# Patient Record
Sex: Female | Born: 1995 | Race: Asian | Hispanic: No | Marital: Single | Smoking: Never smoker
Health system: Southern US, Community
[De-identification: ages and names within clinical notes are randomized; demographics above are authoritative.]

## PROBLEM LIST (undated history)

## (undated) DIAGNOSIS — L900 Lichen sclerosus et atrophicus: Secondary | ICD-10-CM

## (undated) DIAGNOSIS — D75A Glucose-6-phosphate dehydrogenase (G6PD) deficiency without anemia: Secondary | ICD-10-CM

## (undated) DIAGNOSIS — G43909 Migraine, unspecified, not intractable, without status migrainosus: Secondary | ICD-10-CM

## (undated) DIAGNOSIS — L209 Atopic dermatitis, unspecified: Secondary | ICD-10-CM

## (undated) HISTORY — DX: Migraine, unspecified, not intractable, without status migrainosus: G43.909

## (undated) HISTORY — DX: Atopic dermatitis, unspecified: L20.9

## (undated) HISTORY — DX: Glucose-6-phosphate dehydrogenase (G6PD) deficiency without anemia: D75.A

## (undated) HISTORY — DX: Lichen sclerosus et atrophicus: L90.0

---
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
## 2018-03-19 ENCOUNTER — Encounter (HOSPITAL_COMMUNITY): Payer: Self-pay

## 2018-03-19 ENCOUNTER — Other Ambulatory Visit: Payer: Self-pay

## 2018-03-19 ENCOUNTER — Ambulatory Visit (HOSPITAL_COMMUNITY)
Admission: EM | Admit: 2018-03-19 | Discharge: 2018-03-19 | Disposition: A | Discharge status: Home / Self Care | Payer: 59 | Attending: Urgent Care | Admitting: Urgent Care

## 2018-03-19 DIAGNOSIS — B9789 Other viral agents as the cause of diseases classified elsewhere: Secondary | ICD-10-CM | POA: Insufficient documentation

## 2018-03-19 DIAGNOSIS — J069 Acute upper respiratory infection, unspecified: Secondary | ICD-10-CM | POA: Diagnosis not present

## 2018-03-19 DIAGNOSIS — R5383 Other fatigue: Secondary | ICD-10-CM | POA: Insufficient documentation

## 2018-03-19 DIAGNOSIS — R5381 Other malaise: Secondary | ICD-10-CM | POA: Insufficient documentation

## 2018-03-19 MED ORDER — PROMETHAZINE-DM 6.25-15 MG/5ML PO SYRP: 5.0000 mL | ORAL_SOLUTION | Freq: Two times a day (BID) | ORAL | 0 refills | Status: DC | PRN

## 2018-03-19 MED ORDER — PREDNISONE 20 MG PO TABS: ORAL_TABLET | ORAL | 0 refills | Status: DC

## 2018-03-19 MED ORDER — BENZONATATE 100 MG PO CAPS: 100.0000 mg | ORAL_CAPSULE | Freq: Three times a day (TID) | ORAL | 0 refills | Status: DC | PRN

## 2018-03-19 NOTE — ED Triage Notes (Signed)
Pt cc coughing, chills , fatigued and runny nose x 1 week.

## 2018-03-19 NOTE — Discharge Instructions (Signed)
For sore throat try using a honey-based tea. Use 3 teaspoons of honey with juice squeezed from half lemon. Place shaved pieces of ginger into 1/2-1 cup of water and warm over stove top. Then mix the ingredients and repeat every 4 hours as needed. We will manage this as a viral syndrome. Please take ibuprofen 400mg  every 6 hours alternating with Tylenol 500mg  every 6 hours. Hydrate very well with at least 2 liters of water. Eat light meals such as soups to replenish electrolytes and get good nutrition. Start an antihistamine like Zyrtec, Allegra or Claritin.

## 2018-03-19 NOTE — ED Provider Notes (Signed)
MRN: [id number] DOB: [birth year]  Subjective:   Tara Merritt is a 23 y.o. female presenting for 1 week history of persistent, worsening, constant dry hacking cough that elicits throat pain. Has tried DayQuil, NyQuil, Tussin, Mucinex.  Denies using chronic medications.  Denies history of asthma, allergies. Denies smoking cigarettes.   No Known Allergies  Has a history of eczema, uses clobetasol.    Past Surgical History:  Procedure Laterality Date  . WISDOM TOOTH EXTRACTION  2013   Review of Systems  Constitutional: Positive for chills, fever (initially but improved) and malaise/fatigue.  HENT: Positive for congestion. Negative for ear pain and sinus pain.        Left-sided ear fullness that is now improved.  Eyes: Negative for blurred vision and double vision.  Respiratory: Positive for shortness of breath (with coughing fits). Negative for hemoptysis.   Cardiovascular: Negative for chest pain and palpitations.  Gastrointestinal: Negative for abdominal pain, constipation, diarrhea, nausea and vomiting.  Genitourinary: Negative for dysuria and frequency.  Musculoskeletal: Negative for myalgias.  Skin: Negative for rash.  Neurological: Positive for headaches (initially but now improved). Negative for dizziness.  Psychiatric/Behavioral: Negative for depression.   Objective:   Vitals: BP (!) 109/55 (BP Location: Left Arm)   Pulse 75   Temp 97.9 F (36.6 C)   Resp 18   Wt 105 lb (47.6 kg)   LMP 03/14/2018   SpO2 100%   Physical Exam Constitutional:      General: She is not in acute distress.    Appearance: Normal appearance. She is well-developed. She is not ill-appearing, toxic-appearing or diaphoretic.  HENT:     Head: Normocephalic and atraumatic.     Right Ear: Tympanic membrane and ear canal normal. No drainage or tenderness. No middle ear effusion. Tympanic membrane is not erythematous.     Left Ear: Tympanic membrane and ear canal normal. No drainage or tenderness.   No middle ear effusion. Tympanic membrane is not erythematous.     Nose: Nose normal. No congestion or rhinorrhea.     Mouth/Throat:     Mouth: Mucous membranes are moist. No oral lesions.     Pharynx: Oropharynx is clear. No pharyngeal swelling, oropharyngeal exudate, posterior oropharyngeal erythema or uvula swelling.     Tonsils: No tonsillar exudate or tonsillar abscesses.  Eyes:     General: No scleral icterus.       Right eye: No discharge.        Left eye: No discharge.     Extraocular Movements: Extraocular movements intact.     Right eye: Normal extraocular motion.     Left eye: Normal extraocular motion.     Conjunctiva/sclera: Conjunctivae normal.     Pupils: Pupils are equal, round, and reactive to light.  Neck:     Musculoskeletal: Normal range of motion and neck supple.  Cardiovascular:     Rate and Rhythm: Normal rate and regular rhythm.     Pulses: Normal pulses.     Heart sounds: Normal heart sounds. No murmur. No friction rub. No gallop.   Pulmonary:     Effort: Pulmonary effort is normal. No respiratory distress.     Breath sounds: Normal breath sounds. No stridor. No wheezing, rhonchi or rales.  Lymphadenopathy:     Cervical: No cervical adenopathy.  Skin:    General: Skin is warm and dry.     Findings: No rash.  Neurological:     General: No focal deficit present.     Mental Status:  She is alert and oriented to person, place, and time.  Psychiatric:        Mood and Affect: Mood normal.        Behavior: Behavior normal.        Thought Content: Thought content normal.     Assessment and Plan :   Viral URI with cough  Malaise and fatigue  Likely viral in etiology d/t reassuring physical exam findings. Advised supportive care, offered symptomatic relief.  Patient would like to be aggressive in her management given that she is going to travel soon and wants to feel relief from her cough already.  We agreed to use prednisone which she has used in the past.  Counseled patient on potential for adverse effects with medications prescribed today, patient verbalized understanding.  Return-to-clinic precautions discussed, patient verbalized understanding.     Tara Merritt, Tara Merritt, [place] 03/19/18 [phone]

## 2018-04-02 ENCOUNTER — Ambulatory Visit: Payer: Self-pay | Admitting: *Deleted

## 2018-04-02 NOTE — Telephone Encounter (Signed)
Pt reports productive cough, greenish phlegm. LGT of 99.0 this am. Reports sinus pressure, facial tenderness, eyes watery. Pt seen in ED 03/19/2018 for similar symptoms. States "Felt better then symptoms reoccured while in [place] 1/16 to 1/22,  along with a stomach virus."  Pts main concern is she had been around coworkers while in [place] who had been to [place]. States worried she has been exposed to Coronavirus. States her boss also has cough, "But he said it wasn't coronavirus."  Pt has New Patient appt tomorrow with Dr. Mardelle Matte. She is questioning if testing for this virus is available. Pt had to end call prior to any disposition as she is at work.   Requesting CB with advise. [phone]  Reason for Disposition . [1] Fever returns after gone for over 24 hours AND [2] symptoms worse or not improved  Answer Assessment - Initial Assessment Questions 1. ONSET: "When did the cough begin?"      "Beginning of January, 2. SEVERITY: "How bad is the cough today?"      HAs worsened since 23rd 3. RESPIRATORY DISTRESS: "Describe your breathing."      Congested, no SOB 4. FEVER: "Do you have a fever?" If so, ask: "What is your temperature, how was it measured, and when did it start?"     99.0 5. SPUTUM: "Describe the color of your sputum" (clear, white, yellow, green)    Greenish 6. HEMOPTYSIS: "Are you coughing up any blood?" If so ask: "How much?" (flecks, streaks, tablespoons, etc.)     no 7. CARDIAC HISTORY: "Do you have any history of heart disease?" (e.g., heart attack, congestive heart failure)      no 8. LUNG HISTORY: "Do you have any history of lung disease?"  (e.g., pulmonary embolus, asthma, emphysema)     no 9. PE RISK FACTORS: "Do you have a history of blood clots?" (or: recent major surgery, recent prolonged travel, bedridden)     no 10. OTHER SYMPTOMS: "Do you have any other symptoms?" (e.g., runny nose, wheezing, chest pain)       Sinus tenderness, eyes watery; "Pseudofed helped" 11.  PREGNANCY: "Is there any chance you are pregnant?" "When was your last menstrual period?"        12. TRAVEL: "Have you traveled out of the country in the last month?" (e.g., travel history, exposures)      Yes; to [place] Jan. 16th to 22nd.Business trip, around co-workers who had been to [place].  Protocols used: COUGH - ACUTE PRODUCTIVE-A-AH

## 2018-04-02 NOTE — Telephone Encounter (Signed)
Pt aware and verbalized understanding.  

## 2018-04-02 NOTE — Telephone Encounter (Signed)
Please see below and advise.

## 2018-04-02 NOTE — Telephone Encounter (Signed)
Please call patient: currently, she does not meet criteria for a suspected case of the newly identified coronavirus.  Please report fever > 101.5 and contact with a pt who traveled to/from [place] and is under investigation for the virus.  Because she has not had contact with a suspected or confirmed case and is not having fevers and has traveled to [place] herself, I suspect she has a different infection.

## 2018-04-03 ENCOUNTER — Encounter: Payer: Self-pay | Admitting: Family Medicine

## 2018-04-03 ENCOUNTER — Other Ambulatory Visit: Payer: Self-pay

## 2018-04-03 ENCOUNTER — Ambulatory Visit (INDEPENDENT_AMBULATORY_CARE_PROVIDER_SITE_OTHER): Payer: 59 | Admitting: Family Medicine

## 2018-04-03 VITALS — BP 104/72 | HR 70 | Temp 98.6°F | Resp 14 | Ht 59.5 in | Wt 102.8 lb

## 2018-04-03 DIAGNOSIS — L239 Allergic contact dermatitis, unspecified cause: Secondary | ICD-10-CM | POA: Insufficient documentation

## 2018-04-03 DIAGNOSIS — L900 Lichen sclerosus et atrophicus: Secondary | ICD-10-CM

## 2018-04-03 DIAGNOSIS — L209 Atopic dermatitis, unspecified: Secondary | ICD-10-CM

## 2018-04-03 DIAGNOSIS — J208 Acute bronchitis due to other specified organisms: Secondary | ICD-10-CM | POA: Diagnosis not present

## 2018-04-03 DIAGNOSIS — D75A Glucose-6-phosphate dehydrogenase (G6PD) deficiency without anemia: Secondary | ICD-10-CM | POA: Diagnosis not present

## 2018-04-03 DIAGNOSIS — B9689 Other specified bacterial agents as the cause of diseases classified elsewhere: Secondary | ICD-10-CM

## 2018-04-03 DIAGNOSIS — Z3041 Encounter for surveillance of contraceptive pills: Secondary | ICD-10-CM | POA: Insufficient documentation

## 2018-04-03 HISTORY — DX: Atopic dermatitis, unspecified: L20.9

## 2018-04-03 HISTORY — DX: Glucose-6-phosphate dehydrogenase (G6PD) deficiency without anemia: D75.A

## 2018-04-03 HISTORY — DX: Lichen sclerosus et atrophicus: L90.0

## 2018-04-03 MED ORDER — AMOXICILLIN-POT CLAVULANATE 875-125 MG PO TABS: 1.0000 | ORAL_TABLET | Freq: Two times a day (BID) | ORAL | 0 refills | Status: DC

## 2018-04-03 NOTE — Progress Notes (Signed)
Subjective  CC:  Chief Complaint  Patient presents with  . Establish Care    Moved to the area recently Dr. Al CorpusHyatt  . Cough    Was in [place] 1/16-1/22    HPI: Tara Merritt is a 23 y.o. female who presents to [facility] today to establish care with me as a new patient.   She has the following concerns or needs:  See triage note from yesterday. Here with coughing URI illness since 1/23: reports productive cough, low grade fevers, myalgias, mild sore throat, chills and thick drainage w/o sob or chest pain. No GI sxs. Has recently been to [place]. Was treated with pred and cough meds 1/9 by UC and improved but cough never completely resolved. No h/o asthma. Nonsmoker. Hasn't had flu shot yet this season but is willing to get it. No known exposure to new coronavirus patient or suspected patient.   Reviewed PMH and detailed in PL. All controlled.   Lives alone, long distance relationship with [place] who lives in [place]. 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 Parents live in [place] and are retired. No siblings. Adopted. No h/o mood problems. Due for cpe with pap.   Assessment  1. Acute bacterial bronchitis   2. G6PD deficiency   3. Oral contraceptive use   4. Lichen sclerosus      Plan   Flu test negative. Bacterial bronchitis vs sinusitis given symptom complex: start augmentin, treat cough and fevers with otc meds and prescribed cough syrup from last visit. Recheck 2 weeks.   Follow up:  Return in about 2 weeks (around 04/17/2018) for complete physical. and recheck cough No orders of the defined types were placed in this encounter.  Meds ordered this encounter  Medications  . amoxicillin-clavulanate (AUGMENTIN) 875-125 MG tablet    Sig: Take 1 tablet by mouth 2 (two) times daily.    Dispense:  14 tablet    Refill:  0     Depression screen [facility] 2/9 04/03/2018 04/03/2018  Decreased Interest 0 0  Down,  Depressed, Hopeless 0 0  PHQ - 2 Score 0 0  Difficult doing work/chores - Not difficult at all    We updated and reviewed the patient's past history in detail and it is documented below.  Patient Active Problem List   Diagnosis Date Noted  . 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 Oral contraceptive use 04/03/2018  . Lichen sclerosus 04/03/2018    On clobetasol   . 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 HIV Screening  01/29/2009  . TETANUS/TDAP  01/29/2013  . PAP-Cervical Cytology Screening  01/30/2015  . PAP SMEAR-Modifier  01/30/2015  . INFLUENZA VACCINE  10/05/2017    There is no immunization history on file for this patient. Current Meds  Medication Sig  . benzonatate (TESSALON) 100 MG capsule Take 1-2 capsules (100-200 mg total) by mouth 3 (three) times daily as needed.  . clobetasol ointment (TEMOVATE) 0.05 % Apply 1 application topically 2 (two) times a week.  . drospirenone-ethinyl estradiol (YAZ,GIANVI,LORYNA) 3-0.02 MG tablet Take 1 tablet by mouth daily.  . Multiple Vitamin (MULTIVITAMIN WITH MINERALS) TABS tablet Take 1 tablet by mouth daily.  . Probiotic Product (PROBIOTIC PO) Take by mouth.  . promethazine-dextromethorphan (PROMETHAZINE-DM) 6.25-15 MG/5ML syrup Take 5 mLs by mouth 2 (two) times daily as needed for cough.    Allergies: Patient has No Known Allergies. Past Medical History Patient  has a past medical history of Atopic dermatitis (04/03/2018), G6PD deficiency (04/03/2018), Lichen sclerosus (04/03/2018), and Migraine. Past Surgical History Patient  has a past surgical history that includes Wisdom tooth extraction (2013). Family History: Patient family history includes Healthy in her father and mother. She was adopted. Social History:  Patient  reports that she has never smoked. She has never used smokeless tobacco. She reports current alcohol use. She reports that she does not use drugs.  Review of Systems: Constitutional:  negative for fever or malaise Ophthalmic: negative for photophobia, double vision or loss of vision Cardiovascular: negative for chest pain, dyspnea on exertion, or new LE swelling Respiratory: negative for SOB +persistent cough Gastrointestinal: negative for abdominal pain, change in bowel habits or melena Genitourinary: negative for dysuria or gross hematuria Musculoskeletal: negative for new gait disturbance or muscular weakness Integumentary: negative for new or persistent rashes Neurological: negative for TIA or stroke symptoms Psychiatric: negative for SI or delusions Allergic/Immunologic: negative for hives  Patient Care Team    Relationship Specialty Notifications Start End  [name], MD PCP - General Family Medicine  04/03/18     Objective  Vitals: BP 104/72   Pulse 70   Temp 98.6 F (37 C) (Oral)   Resp 14   Ht 4' 11.5" (1.511 m)   Wt 102 lb 12.8 oz (46.6 kg)   LMP 03/29/2018   SpO2 98%   BMI 20.42 kg/m  General:  Well developed, well nourished, no acute distress , clammy with some coughing, no respiratory distress Psych:  Alert and oriented,normal mood and affect HEENT:  Normocephalic, atraumatic, non-icteric sclera, PERRL, oropharynx is without mass or exudate, supple neck without adenopathy, mass or thyromegaly Cardiovascular:  RRR without gallop, rub or murmur, nondisplaced PMI Respiratory:  Good breath sounds bilaterally, CTAB with normal respiratory effort Gastrointestinal: normal bowel sounds, soft, non-tender, no noted masses. No HSM MSK: no deformities, contusions. Joints are without erythema or swelling Skin:  Warm, no rashes or suspicious lesions noted Neurologic:    Mental status is normal. Normal gait  Rapid flu test negative.   Commons side effects, risks, benefits, and alternatives for medications and treatment plan prescribed today were discussed, and the patient expressed understanding of the given instructions. Patient is instructed to call  or message via MyChart if he/she has any questions or concerns regarding our treatment plan. No barriers to understanding were identified. We discussed Red Flag symptoms and signs in detail. Patient expressed understanding regarding what to do in case of urgent or emergency type symptoms.   Medication list was reconciled, printed and provided to the patient in AVS. Patient instructions and summary information was reviewed with the patient as documented in the AVS. This note was prepared with assistance of Dragon voice recognition software. Occasional wrong-word or sound-a-like substitutions may have occurred due to the inherent limitations of voice recognition software

## 2018-04-03 NOTE — Patient Instructions (Addendum)
Please return in 2-4 weeks for your annual complete physical; please come fasting and to recheck cough.  Take all of the antibiotics as prescribed.  You may use Delsym cough syrup or Mucinex DM to help with congestion and coughing.  It was a pleasure meeting you today! Thank you for choosing us to meet your healthcare needs! I truly look forward to working with you. If you have any questions or concerns, please send me a message via Mychart or call the office at [phone].

## 2018-04-17 ENCOUNTER — Ambulatory Visit: Payer: Self-pay | Admitting: *Deleted

## 2018-04-17 NOTE — Telephone Encounter (Signed)
Yes, use the monistat. Will f/u tomorrow in office

## 2018-04-17 NOTE — Telephone Encounter (Signed)
Please see below and advise.

## 2018-04-17 NOTE — Telephone Encounter (Signed)
Pt is aware and advised to use the cream that came with the monistat

## 2018-04-17 NOTE — Telephone Encounter (Signed)
Pt completed 7 day course of  augmentin, ordered 04/03/2018.  States now with vaginal discharge, burning with urination. States discharge is "Normal" consistency, malodorous, bright yellow.  States she has H/O vaginal yeast infections "But this seems different." States has not been sexually active for 3 months.  Denies abdominal pain, lower back pain. States dysuria yesterday, not today. Started Monistat suppositories yesterday and is to complete 2 more days. Pt questioning if she should continue these or stop. Pt has appt tomorrow with Dr. Mardelle Matte CPE. Pt anxious; aware of appt tomorrow but would like CB regarding use of  Monistat. CB: [phone]    Reason for Disposition . Bad smelling vaginal discharge  Answer Assessment - Initial Assessment Questions 1. DISCHARGE: "Describe the discharge." (e.g., white, yellow, green, gray, foamy, cottage cheese-like)    "Bright yellow" 2. ODOR: "Is there a bad odor?"     Yes 3. ONSET: "When did the discharge begin?"     3 days ago 4. RASH: "Is there a rash in that area?" If so, ask: "Describe it." (e.g., redness, blisters, sores, bumps)    no 5. ABDOMINAL PAIN: "Are you having any abdominal pain?" If yes: "What does it feel like? " (e.g., crampy, dull, intermittent, constant)     no 6. ABDOMINAL PAIN SEVERITY: If present, ask: "How bad is it?"  (e.g., mild, moderate, severe)  - MILD - doesn't interfere with normal activities   - MODERATE - interferes with normal activities or awakens from sleep   - SEVERE - patient doesn't want to move (R/O peritonitis)      N/A 7. CAUSE: "What do you think is causing the discharge?" "Have you had the same problem before? What happened then?"    "Possibly yeast but seems different than ones I've had before." 8. OTHER SYMPTOMS: "Do you have any other symptoms?" (e.g., fever, itching, vaginal bleeding, pain with urination, injury to genital area, vaginal foreign body)     Burning with urination, none today 9. PREGNANCY:  "Is there any chance you are pregnant?" "When was your last menstrual period?"  Protocols used: VAGINAL DISCHARGE-A-AH

## 2018-04-18 ENCOUNTER — Other Ambulatory Visit: Payer: Self-pay

## 2018-04-18 ENCOUNTER — Other Ambulatory Visit (HOSPITAL_COMMUNITY)
Admission: RE | Admit: 2018-04-18 | Discharge: 2018-04-18 | Disposition: A | Discharge status: Home / Self Care | Payer: 59 | Source: Ambulatory Visit | Attending: Family Medicine | Admitting: Family Medicine

## 2018-04-18 ENCOUNTER — Encounter: Payer: Self-pay | Admitting: Family Medicine

## 2018-04-18 ENCOUNTER — Ambulatory Visit (INDEPENDENT_AMBULATORY_CARE_PROVIDER_SITE_OTHER): Payer: 59 | Admitting: Family Medicine

## 2018-04-18 VITALS — BP 102/66 | HR 74 | Temp 98.3°F | Resp 14 | Ht 60.0 in | Wt 102.2 lb

## 2018-04-18 DIAGNOSIS — Z124 Encounter for screening for malignant neoplasm of cervix: Secondary | ICD-10-CM

## 2018-04-18 DIAGNOSIS — L900 Lichen sclerosus et atrophicus: Secondary | ICD-10-CM | POA: Diagnosis not present

## 2018-04-18 DIAGNOSIS — Z0000 Encounter for general adult medical examination without abnormal findings: Secondary | ICD-10-CM | POA: Insufficient documentation

## 2018-04-18 DIAGNOSIS — Z3041 Encounter for surveillance of contraceptive pills: Secondary | ICD-10-CM

## 2018-04-18 DIAGNOSIS — N898 Other specified noninflammatory disorders of vagina: Secondary | ICD-10-CM | POA: Insufficient documentation

## 2018-04-18 DIAGNOSIS — D75A Glucose-6-phosphate dehydrogenase (G6PD) deficiency without anemia: Secondary | ICD-10-CM

## 2018-04-18 LAB — CBC WITH DIFFERENTIAL/PLATELET
Basophils Absolute: 0 10*3/uL (ref 0.0–0.1)
Basophils Relative: 0.5 % (ref 0.0–3.0)
Eosinophils Absolute: 0.3 10*3/uL (ref 0.0–0.7)
Eosinophils Relative: 3.4 % (ref 0.0–5.0)
HCT: 39.4 % (ref 36.0–46.0)
HEMOGLOBIN: 13.1 g/dL (ref 12.0–15.0)
Lymphocytes Relative: 24.5 % (ref 12.0–46.0)
Lymphs Abs: 2.1 10*3/uL (ref 0.7–4.0)
MCHC: 33.3 g/dL (ref 30.0–36.0)
MCV: 86.2 fl (ref 78.0–100.0)
MONO ABS: 0.6 10*3/uL (ref 0.1–1.0)
Monocytes Relative: 7.6 % (ref 3.0–12.0)
Neutro Abs: 5.4 10*3/uL (ref 1.4–7.7)
Neutrophils Relative %: 64 % (ref 43.0–77.0)
Platelets: 274 10*3/uL (ref 150.0–400.0)
RBC: 4.57 Mil/uL (ref 3.87–5.11)
RDW: 12.9 % (ref 11.5–15.5)
WBC: 8.4 10*3/uL (ref 4.0–10.5)

## 2018-04-18 LAB — LIPID PANEL
Cholesterol: 165 mg/dL (ref 0–200)
HDL: 58.5 mg/dL (ref >39.00)
LDL Cholesterol: 73 mg/dL (ref 0–99)
NonHDL: 106.09
TRIGLYCERIDES: 165 mg/dL — AB (ref 0.0–149.0)
Total CHOL/HDL Ratio: 3
VLDL: 33 mg/dL (ref 0.0–40.0)

## 2018-04-18 LAB — COMPREHENSIVE METABOLIC PANEL
ALT: 15 U/L (ref 0–35)
AST: 16 U/L (ref 0–37)
Albumin: 4.3 g/dL (ref 3.5–5.2)
Alkaline Phosphatase: 77 U/L (ref 39–117)
BUN: 11 mg/dL (ref 6–23)
CO2: 27 mEq/L (ref 19–32)
Calcium: 9.5 mg/dL (ref 8.4–10.5)
Chloride: 102 mEq/L (ref 96–112)
Creatinine, Ser: 0.69 mg/dL (ref 0.40–1.20)
GFR: 104.34 mL/min (ref >60.00)
Glucose, Bld: 73 mg/dL (ref 70–99)
POTASSIUM: 4.4 meq/L (ref 3.5–5.1)
Sodium: 137 mEq/L (ref 135–145)
Total Bilirubin: 0.5 mg/dL (ref 0.2–1.2)
Total Protein: 7.1 g/dL (ref 6.0–8.3)

## 2018-04-18 NOTE — Patient Instructions (Addendum)
Please return in 12 months for your annual complete physical; please come fasting.  Please sign up for Mychart so I may release your lab test results to you when they are ready.   If you have any questions or concerns, please don't hesitate to send me a message via MyChart or call the office at [phone]. Thank you for visiting with us today! It's our pleasure caring for you.  Please do these things to maintain good health!   Exercise at least 30-45 minutes a day,  4-5 days a week.   Eat a low-fat diet with lots of fruits and vegetables, up to 7-9 servings per day.  Drink plenty of water daily. Try to drink 8 8oz glasses per day.  Seatbelts can save your life. Always wear your seatbelt.  Place Smoke Detectors on every level of your home and check batteries every year.  Schedule an appointment with an eye doctor for an eye exam every 1-2 years  Safe sex - use condoms to protect yourself from STDs if you could be exposed to these types of infections. Use birth control if you do not want to become pregnant and are sexually active.  Avoid heavy alcohol use. If you drink, keep it to less than 2 drinks/day and not every day.  Health Care Power of Attorney.  Choose someone you trust that could speak for you if you became unable to speak for yourself.  Depression is common in our stressful world.If you're feeling down or losing interest in things you normally enjoy, please come in for a visit.  If anyone is threatening or hurting you, please get help. Physical or Emotional Violence is never OK.

## 2018-04-18 NOTE — Progress Notes (Signed)
Subjective  Chief Complaint  Patient presents with  . Establish Care    She is not fasting..  . Cough    improved  . Gynecologic Exam    Wants STD screening as well    HPI: Tara Merritt is a 23 y.o. female who presents to [facility]) today for a Female Wellness Visit.  She also has the concerns and/or needs as listed above in the chief complaint. These will be addressed in addition to the Health Maintenance Visit.   Wellness Visit: annual visit with health maintenance review and exam with Pap   Annual exam with Pap smear.  This is the patient's second.  Healthy lifestyle.  Monogamous.  On birth control with regular cycles.  No history of STIs.  Vaginal discharge after taking antibiotics last week.  Started on over-the-counter Monistat.  Send note yesterday due to yellow discharge.  She denies pelvic pain, fevers, irregular bleeding, lumps bumps or sores.  She would like STI testing today.  No urinary symptoms Chronic disease management visit and/or acute problem visit:  Lichen sclerosus which is well controlled with intermittent high-dose steroid.  G6PD deficiency without symptoms or complications Assessment  1. Annual physical exam   2. Cervical cancer screening   3. Vaginal discharge   4. Lichen sclerosus   5. Oral contraceptive use   6. G6PD deficiency      Plan  Female Wellness Visit:  Age appropriate Health Maintenance and Prevention measures were discussed with patient. Included topics are cancer screening recommendations, ways to keep healthy (see AVS) including dietary and exercise recommendations, regular eye and dental care, use of seat belts, and avoidance of moderate alcohol use and tobacco use.  Pap smear obtained with STI testing today.  BMI: discussed patient's BMI and encouraged positive lifestyle modifications to help get to or maintain a target BMI.  HM needs and immunizations were addressed and ordered. See below for  orders. See HM and immunization section for updates.  Routine labs and screening tests ordered including cmp, cbc and lipids where appropriate.  Discussed recommendations regarding Vit D and calcium supplementation (see AVS)  Chronic disease f/u and/or acute problem visit: (deemed necessary to be done in addition to the wellness visit):  Vaginitis: Most consistent with yeast vaginitis secondary to antibiotic use.  To complete over-the-counter treatment.  Await testing.  Reassured  Continue oral contraceptive use.  Follow up: Return in about 1 year (around 04/19/2019) for complete physical.   Orders Placed This Encounter  Procedures  . CBC with Differential/Platelet  . Comprehensive metabolic panel  . Lipid panel  . HIV Antibody (routine testing w rflx)   No orders of the defined types were placed in this encounter.     Lifestyle: Body mass index is 19.96 kg/m. Wt Readings from Last 3 Encounters:  04/18/18 102 lb 3.2 oz (46.4 kg)  04/03/18 102 lb 12.8 oz (46.6 kg)  03/19/18 105 lb (47.6 kg)   Diet: low fat Exercise: frequently,  Need for contraception: Yes, OCP (estrogen/progesterone)  Patient Active Problem List   Diagnosis Date Noted  . 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 Oral contraceptive use 04/03/2018  . Lichen sclerosus 04/03/2018    On clobetasol   . 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 Janet Berlin  01/29/2013  . PAP-Cervical Cytology Screening  01/30/2015  . PAP SMEAR-Modifier  01/30/2015  . INFLUENZA VACCINE  10/05/2017  . HIV Screening  Completed    There  is no immunization history on file for this patient. We updated and reviewed the patient's past history in detail and it is documented below. Allergies: Patient  reports current alcohol use. Past Medical History Patient  has a past medical history of Atopic dermatitis (04/03/2018), G6PD deficiency (04/03/2018), Lichen sclerosus (04/03/2018), and Migraine. Past Surgical  History Patient  has a past surgical history that includes Wisdom tooth extraction (2013). Social History   Socioeconomic History  . Marital status: Single    Spouse name: Not on file  . Number of children: Not on file  . Years of education: Not on file  . Highest education level: Not on file  Occupational History  . Not on file  Social Needs  . Financial resource strain: Not on file  . Food insecurity:    Worry: Not on file    Inability: Not on file  . Transportation needs:    Medical: Not on file    Non-medical: Not on file  Tobacco Use  . Smoking status: Never Smoker  . Smokeless tobacco: Never Used  Substance and Sexual Activity  . Alcohol use: Yes  . Drug use: Never  . Sexual activity: Yes  Lifestyle  . Physical activity:    Days per week: Not on file    Minutes per session: Not on file  . Stress: Not on file  Relationships  . Social connections:    Talks on phone: Not on file    Gets together: Not on file    Attends religious service: Not on file    Active member of club or organization: Not on file    Attends meetings of clubs or organizations: Not on file    Relationship status: Not on file  Other Topics Concern  . Not on file  Social History Narrative  . Not on file   Family History  Adopted: Yes  Problem Relation Age of Onset  . Healthy Mother   . Healthy Father     Review of Systems: Constitutional: negative for fever or malaise Ophthalmic: negative for photophobia, double vision or loss of vision Cardiovascular: negative for chest pain, dyspnea on exertion, or new LE swelling Respiratory: negative for SOB or persistent cough Gastrointestinal: negative for abdominal pain, change in bowel habits or melena Genitourinary: negative for dysuria or gross hematuria, no abnormal uterine bleeding or disharge Musculoskeletal: negative for new gait disturbance or muscular weakness Integumentary: negative for new or persistent rashes, no breast  lumps Neurological: negative for TIA or stroke symptoms Psychiatric: negative for SI or delusions Allergic/Immunologic: negative for hives  Patient Care Team    Relationship Specialty Notifications Start End  Willow OraAndy, Camille L, MD PCP - General Family Medicine  04/03/18     Objective  Vitals: BP 102/66   Pulse 74   Temp 98.3 F (36.8 C) (Oral)   Resp 14   Ht 5' (1.524 m)   Wt 102 lb 3.2 oz (46.4 kg)   LMP 03/29/2018   SpO2 98%   BMI 19.96 kg/m  General:  Well developed, well nourished, no acute distress  Psych:  Alert and orientedx3,normal mood and affect HEENT:  Normocephalic, atraumatic, non-icteric sclera, PERRL, oropharynx is clear without mass or exudate, supple neck without adenopathy, mass or thyromegaly Cardiovascular:  Normal S1, S2, RRR without gallop, rub or murmur, nondisplaced PMI Respiratory:  Good breath sounds bilaterally, CTAB with normal respiratory effort Gastrointestinal: normal bowel sounds, soft, non-tender, no noted masses. No HSM MSK: no deformities, contusions. Joints are without erythema  or swelling. Spine and CVA region are nontender Skin:  Warm, no rashes or suspicious lesions noted Neurologic:    Mental status is normal. CN 2-11 are normal. Gross motor and sensory exams are normal. Normal gait. No tremor Breast Exam: No mass, skin retraction or nipple discharge is appreciated in either breast. No axillary adenopathy. Fibrocystic changes are not noted Pelvic Exam: Normal external genitalia, no vulvar or vaginal lesions present.  White curd-like discharge present.  Clear cervix w/o CMT. Bimanual exam reveals a nontender fundus w/o masses, nl size. No adnexal masses present. No inguinal adenopathy. A PAP smear was performed.     Commons side effects, risks, benefits, and alternatives for medications and treatment plan prescribed today were discussed, and the patient expressed understanding of the given instructions. Patient is instructed to call or message  via MyChart if he/she has any questions or concerns regarding our treatment plan. No barriers to understanding were identified. We discussed Red Flag symptoms and signs in detail. Patient expressed understanding regarding what to do in case of urgent or emergency type symptoms.   Medication list was reconciled, printed and provided to the patient in AVS. Patient instructions and summary information was reviewed with the patient as documented in the AVS. This note was prepared with assistance of Dragon voice recognition software. Occasional wrong-word or sound-a-like substitutions may have occurred due to the inherent limitations of voice recognition software

## 2018-04-19 LAB — HIV ANTIBODY (ROUTINE TESTING W REFLEX): HIV 1&2 Ab, 4th Generation: NONREACTIVE

## 2018-04-20 LAB — CYTOLOGY - PAP: Diagnosis: NEGATIVE

## 2018-06-18 ENCOUNTER — Ambulatory Visit (INDEPENDENT_AMBULATORY_CARE_PROVIDER_SITE_OTHER): Payer: 59 | Admitting: Family Medicine

## 2018-06-18 ENCOUNTER — Other Ambulatory Visit: Payer: Self-pay

## 2018-06-18 ENCOUNTER — Encounter: Payer: Self-pay | Admitting: Family Medicine

## 2018-06-18 ENCOUNTER — Encounter: Payer: 59 | Admitting: Family Medicine

## 2018-06-18 VITALS — Temp 97.8°F

## 2018-06-18 DIAGNOSIS — L299 Pruritus, unspecified: Secondary | ICD-10-CM | POA: Diagnosis not present

## 2018-06-18 DIAGNOSIS — K529 Noninfective gastroenteritis and colitis, unspecified: Secondary | ICD-10-CM

## 2018-06-18 LAB — CBC WITH DIFFERENTIAL/PLATELET
Basophils Absolute: 0.1 10*3/uL (ref 0.0–0.1)
Basophils Relative: 0.9 % (ref 0.0–3.0)
Eosinophils Absolute: 0.4 10*3/uL (ref 0.0–0.7)
Eosinophils Relative: 4.4 % (ref 0.0–5.0)
HCT: 38.2 % (ref 36.0–46.0)
Hemoglobin: 12.8 g/dL (ref 12.0–15.0)
Lymphocytes Relative: 31.2 % (ref 12.0–46.0)
Lymphs Abs: 2.5 10*3/uL (ref 0.7–4.0)
MCHC: 33.5 g/dL (ref 30.0–36.0)
MCV: 85.2 fl (ref 78.0–100.0)
Monocytes Absolute: 0.6 10*3/uL (ref 0.1–1.0)
Monocytes Relative: 7.6 % (ref 3.0–12.0)
Neutro Abs: 4.6 10*3/uL (ref 1.4–7.7)
Neutrophils Relative %: 55.9 % (ref 43.0–77.0)
Platelets: 251 10*3/uL (ref 150.0–400.0)
RBC: 4.48 Mil/uL (ref 3.87–5.11)
RDW: 12.6 % (ref 11.5–15.5)
WBC: 8.2 10*3/uL (ref 4.0–10.5)

## 2018-06-18 LAB — COMPREHENSIVE METABOLIC PANEL
ALT: 12 U/L (ref 0–35)
AST: 17 U/L (ref 0–37)
Albumin: 4.1 g/dL (ref 3.5–5.2)
Alkaline Phosphatase: 63 U/L (ref 39–117)
BUN: 8 mg/dL (ref 6–23)
CO2: 28 mEq/L (ref 19–32)
Calcium: 9.7 mg/dL (ref 8.4–10.5)
Chloride: 102 mEq/L (ref 96–112)
Creatinine, Ser: 0.64 mg/dL (ref 0.40–1.20)
GFR: 113.64 mL/min (ref >60.00)
Glucose, Bld: 85 mg/dL (ref 70–99)
Potassium: 4 mEq/L (ref 3.5–5.1)
Sodium: 138 mEq/L (ref 135–145)
Total Bilirubin: 0.3 mg/dL (ref 0.2–1.2)
Total Protein: 6.9 g/dL (ref 6.0–8.3)

## 2018-06-18 LAB — SEDIMENTATION RATE: Sed Rate: 12 mm/hr (ref 0–20)

## 2018-06-18 MED ORDER — CIPROFLOXACIN HCL 500 MG PO TABS: 500.0000 mg | ORAL_TABLET | Freq: Two times a day (BID) | ORAL | 0 refills | Status: AC

## 2018-06-18 NOTE — Addendum Note (Signed)
Addended by: Young Berry T on: 06/18/2018 02:40 PM   Modules accepted: Orders

## 2018-06-18 NOTE — Progress Notes (Signed)
I have discussed the procedure for the virtual visit with the patient who has given consent to proceed with assessment and treatment.   Shadavia Dampier S Quadir Muns, CMA     

## 2018-06-18 NOTE — Addendum Note (Signed)
Addended by: Young Berry T on: 06/18/2018 02:51 PM   Modules accepted: Orders

## 2018-06-18 NOTE — Progress Notes (Signed)
Virtual Visit via Video Note  Subjective  CC:  Chief Complaint  Patient presents with  . Eczema    Increased itching.. All over lower legs and lower back work. She reports palms hand and soles of feet burn at night.. 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 Bowel changes    Painful with some blood and states that looks like mucous mixed in    HPI:  I connected with Tara Merritt on 06/18/18 at 11:20 AM EDT by a video enabled telemedicine application and verified that I am speaking with the correct person using two identifiers. Location patient: Home Location provider: [employer], Office Persons participating in the virtual visit: Rome Bevans, [name], MD Rita Ohara, CMA   I discussed the limitations of evaluation and management by telemedicine and the availability of in person appointments. The patient expressed understanding and agreed to proceed. . Patient complains of worsening itching skin.  Started several years ago.  She did see a dermatologist and told she was having eczema.  Since she uses CeraVe moisturizing cream.  She denies current rash.  She has had episodes of mild fine bumpy rash but this is mainly after periods of scratching.  She has no localized rashes.  She admits to dry skin.  No systemic symptoms.  Bathes daily.  No shortness of breath. . Change in bowel habits: After antibiotic course for upper respiratory infection about a month ago she started having some constipation.  However that has since changed to loose mucoid stools daily for the last 2 weeks.  She denies melena or blood in the stool.  No fevers, chills, abdominal bloating or abdominal cramping.  Appetite is normal.  No weight changes.  No history of IBS or abnormal stools.  She reports 1 loose bowel movement per day.  She started probiotics 2 days ago without change in symptoms.  No other medications used.  At first, stools were encapsulated with some mucus.  Now passing  large volume of mucus in the bowl.  Assessment  1. Mucoid diarrhea   2. Pruritus      Plan   Mucoid stool: Possibly related to infectious colitis versus inflammatory bowel disease versus change in bacterial flora from recent antibiotic use.  Recommend coming to office for lab work and stool samples.  Then empiric course of Cipro for 1 week.  Follow-up with video visit in 2 weeks.  Recommend further evaluation for abdominal pain or blood in the stool.  If symptoms persist, will need GI evaluation and possible colonoscopy.  Pruritus: Possibly due to xerosis.  Atypical for eczema.  Could be allergic dermatitis.  Recommend Zyrtec, Benadryl at night, and moisturizers.  Decrease bathing.  Oatmeal baths as needed.  Follow-up if not improving. I discussed the assessment and treatment plan with the patient. The patient was provided an opportunity to ask questions and all were answered. The patient agreed with the plan and demonstrated an understanding of the instructions.   The patient was advised to call back or seek an in-person evaluation if the symptoms worsen or if the condition fails to improve as anticipated. Follow up: Return in about 2 weeks (around 07/02/2018) for recheck.  Visit date not found  Meds ordered this encounter  Medications  . ciprofloxacin (CIPRO) 500 MG tablet    Sig: Take 1 tablet (500 mg total) by mouth 2 (two) times daily for 7 days.    Dispense:  14 tablet  Refill:  0      I reviewed the patients updated PMH, FH, and SocHx.    Patient Active Problem List   Diagnosis Date Noted  . 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 Oral contraceptive use 04/03/2018  . Lichen sclerosus 04/03/2018  . 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 clobetasol ointment (TEMOVATE) 0.05 % Apply 1 application topically 2 (two) times a week.  . drospirenone-ethinyl estradiol (YAZ,GIANVI,LORYNA) 3-0.02 MG tablet Take 1 tablet by mouth daily.  . Multiple Vitamin  (MULTIVITAMIN WITH MINERALS) TABS tablet Take 1 tablet by mouth daily.    Allergies: Patient has No Known Allergies. Family History: Patient family history includes Healthy in her father and mother. She was adopted. Social History:  Patient  reports that she has never smoked. She has never used smokeless tobacco. She reports current alcohol use. She reports that she does not use drugs.  Review of Systems: Constitutional: Negative for fever malaise or anorexia Cardiovascular: negative for chest pain Respiratory: negative for SOB or persistent cough Gastrointestinal: negative for abdominal pain  OBJECTIVE Vitals: Temp 97.8 F (36.6 C) (Oral)   LMP 05/28/2018 (Approximate)  General: no acute distress , A&Ox3, reports afebrile Appears well Skin: Clear  Willow Oraamille L Andy, MD

## 2018-06-18 NOTE — Patient Instructions (Signed)
Please schedule a follow up visit in 2 weeks to recheck diarrhea.   If you have any questions or concerns, please don't hesitate to send me a message via MyChart or call the office at [phone]. Thank you for visiting with [place] today! It's our pleasure caring for you.

## 2018-06-18 NOTE — Progress Notes (Signed)
I have discussed the procedure for the virtual visit with the patient who has given consent to proceed with assessment and treatment.   [name], CMA     

## 2018-06-20 ENCOUNTER — Other Ambulatory Visit (INDEPENDENT_AMBULATORY_CARE_PROVIDER_SITE_OTHER): Payer: 59

## 2018-06-20 ENCOUNTER — Other Ambulatory Visit: Payer: Self-pay

## 2018-06-20 DIAGNOSIS — K529 Noninfective gastroenteritis and colitis, unspecified: Secondary | ICD-10-CM

## 2018-06-20 NOTE — Addendum Note (Signed)
Addended by: Young Berry T on: 06/20/2018 10:56 AM   Modules accepted: Orders

## 2018-06-20 NOTE — Addendum Note (Signed)
Addended by: Young Berry T on: 06/20/2018 10:48 AM   Modules accepted: Orders

## 2018-06-21 LAB — FECAL LACTOFERRIN, QUANT
Fecal Lactoferrin: NEGATIVE
MICRO NUMBER:: 397659
SPECIMEN QUALITY:: ADEQUATE

## 2018-06-24 LAB — OVA AND PARASITE EXAMINATION
CONCENTRATE RESULT:: NONE SEEN
MICRO NUMBER:: 397646
SPECIMEN QUALITY:: ADEQUATE
TRICHROME RESULT:: NONE SEEN

## 2018-06-24 LAB — STOOL CULTURE
MICRO NUMBER:: 398582
MICRO NUMBER:: 398583
MICRO NUMBER:: 398584
SHIGA RESULT:: NOT DETECTED
SPECIMEN QUALITY:: ADEQUATE
SPECIMEN QUALITY:: ADEQUATE
SPECIMEN QUALITY:: ADEQUATE

## 2018-07-02 ENCOUNTER — Other Ambulatory Visit: Payer: Self-pay

## 2018-07-02 ENCOUNTER — Encounter: Payer: Self-pay | Admitting: Family Medicine

## 2018-07-02 ENCOUNTER — Ambulatory Visit (INDEPENDENT_AMBULATORY_CARE_PROVIDER_SITE_OTHER): Payer: 59 | Admitting: Family Medicine

## 2018-07-02 DIAGNOSIS — K529 Noninfective gastroenteritis and colitis, unspecified: Secondary | ICD-10-CM | POA: Diagnosis not present

## 2018-07-02 DIAGNOSIS — L239 Allergic contact dermatitis, unspecified cause: Secondary | ICD-10-CM | POA: Diagnosis not present

## 2018-07-02 DIAGNOSIS — L299 Pruritus, unspecified: Secondary | ICD-10-CM

## 2018-07-02 NOTE — Progress Notes (Signed)
I have discussed the procedure for the virtual visit with the patient who has given consent to proceed with assessment and treatment.   Tara Merritt, CMA     

## 2018-07-02 NOTE — Progress Notes (Signed)
Virtual Visit via Video Note  Subjective  CC:  Chief Complaint  Patient presents with  . Diarrhea    She reports that within a few days she was doing better and mucous looks stools have cleared up  . Eczema    Benadryl and moistureizer at night is helping     I connected with Farrell Ours on 07/02/18 at  9:40 AM EDT by a video enabled telemedicine application and verified that I am speaking with the correct person using two identifiers. Location patient: Home Location provider: [employer], Office Persons participating in the virtual visit: Tara Merritt, [name], MD Rita Ohara, CMA  I discussed the limitations of evaluation and management by telemedicine and the availability of in person appointments. The patient expressed understanding and agreed to proceed. HPI: Tara Merritt is a 23 y.o. female who was contacted today to address the problems listed above in the chief complaint. . Follow-up mucoid diarrhea: See last visit, patient had mucoid diarrheal stools ongoing for several weeks.  Some abdominal bloating.  Empirically treated with Cipro.  Stool studies were all negative as was blood work.  She reports today that after 2 to 3 days of Cipro her symptoms resolved.  She continues to feel well and now has normal soft brown formed bowel movements.  No more mucus.  No more loose stools.  Bowel habits have resumed normality, going once a day.  Eating well with normal appetite.  She continues on a probiotic as well. . Atopic dermatitis/pruritus: On Zyrtec nightly and that has decreased her symptoms significantly.  No rash. Assessment  1. Mucoid diarrhea   2. Pruritus   3. Allergic dermatitis      Plan   Mucoid diarrhea: Possible small bowel overgrowth resolved with probiotic and Cipro.  Further education given.  Follow-up if recurs.  To complete 1 to 3 months of probiotics.  Allergic dermatitis/pruritus: Improving with antihistamines.  Discussed  skin care, avoiding overdrying, using moisturizers. I discussed the assessment and treatment plan with the patient. The patient was provided an opportunity to ask questions and all were answered. The patient agreed with the plan and demonstrated an understanding of the instructions.   The patient was advised to call back or seek an in-person evaluation if the symptoms worsen or if the condition fails to improve as anticipated. Follow up: Return in about 10 months (around 05/04/2019) for complete physical.  Visit date not found  No orders of the defined types were placed in this encounter.     I reviewed the patients updated PMH, FH, and SocHx.    Patient Active Problem List   Diagnosis Date Noted  . 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 Oral contraceptive use 04/03/2018  . Lichen sclerosus 04/03/2018  . 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 drospirenone-ethinyl estradiol (YAZ,GIANVI,LORYNA) 3-0.02 MG tablet Take 1 tablet by mouth daily.  . Multiple Vitamin (MULTIVITAMIN WITH MINERALS) TABS tablet Take 1 tablet by mouth daily.    Allergies: Patient has No Known Allergies. Family History: Patient family history includes Healthy in her father and mother. She was adopted. Social History:  Patient  reports that she has never smoked. She has never used smokeless tobacco. She reports current alcohol use. She reports that she does not use drugs.  Review of Systems: Constitutional: Negative for fever malaise or anorexia Cardiovascular: negative for chest pain Respiratory: negative for SOB or persistent cough Gastrointestinal: negative for abdominal pain  OBJECTIVE  Vitals: There were no vitals taken for this visit.  Reports afebrile General: no acute distress , A&Ox3 Appears well Willow Oraamille L , MD

## 2018-11-23 ENCOUNTER — Ambulatory Visit: Payer: Self-pay

## 2018-11-23 NOTE — Telephone Encounter (Signed)
See note

## 2018-11-23 NOTE — Telephone Encounter (Signed)
  Patient called stating that she received the fue vaccine and tetanus on Wednesday and today is home with fever 99.9. She states she has some body aches. No rash. Patient was informed that mild body aches and mild fever were possibly after flu vaccine. She was advised to treat with OTC medications and drink plenty of fluids. She was advised to call back if fever continued.  She states that she is feeling that her symptoms are improving.  She is requesting a note for work. She states she is active on My Chart and would be able to print. Reason for Disposition . Caller has medication question, adult has minor symptoms, caller declines triage, AND triager answers question  Answer Assessment - Initial Assessment Questions 1.   NAME of MEDICATION: "What medicine are you calling about?"     Flu and tetanus vac 2.   QUESTION: "What is your question?" Having fever 99.6 3.   PRESCRIBING HCP: "Who prescribed it?" Reason: if prescribed by specialist, call should be referred to that group.     Dr Jonni Sanger 4. SYMPTOMS: "Do you have any symptoms?"    Fever aches and pains 5. SEVERITY: If symptoms are present, ask "Are they mild, moderate or severe?"   moderate 6.  PREGNANCY:  "Is there any chance that you are pregnant?" "When was your last menstrual period?" No  Protocols used: MEDICATION QUESTION CALL-A-AH

## 2018-11-26 ENCOUNTER — Encounter: Payer: Self-pay | Admitting: *Deleted

## 2018-11-26 NOTE — Telephone Encounter (Signed)
Please send work note. thanks

## 2018-11-26 NOTE — Telephone Encounter (Signed)
Please advise 

## 2018-11-26 NOTE — Telephone Encounter (Signed)
Spoke with pt, she o longer needs work note.

## 2018-12-21 ENCOUNTER — Ambulatory Visit (INDEPENDENT_AMBULATORY_CARE_PROVIDER_SITE_OTHER): Payer: 59 | Admitting: Family Medicine

## 2018-12-21 ENCOUNTER — Other Ambulatory Visit (HOSPITAL_COMMUNITY)
Admission: RE | Admit: 2018-12-21 | Discharge: 2018-12-21 | Disposition: A | Discharge status: Home / Self Care | Payer: 59 | Source: Ambulatory Visit | Attending: Family Medicine | Admitting: Family Medicine

## 2018-12-21 ENCOUNTER — Encounter: Payer: Self-pay | Admitting: Family Medicine

## 2018-12-21 ENCOUNTER — Other Ambulatory Visit: Payer: Self-pay

## 2018-12-21 VITALS — BP 106/68 | HR 72 | Temp 98.4°F | Resp 14 | Ht 60.0 in | Wt 107.4 lb

## 2018-12-21 DIAGNOSIS — R300 Dysuria: Secondary | ICD-10-CM

## 2018-12-21 DIAGNOSIS — Z113 Encounter for screening for infections with a predominantly sexual mode of transmission: Secondary | ICD-10-CM

## 2018-12-21 DIAGNOSIS — R102 Pelvic and perineal pain: Secondary | ICD-10-CM | POA: Diagnosis present

## 2018-12-21 LAB — POCT URINALYSIS DIPSTICK
Bilirubin, UA: NEGATIVE
Blood, UA: POSITIVE
Glucose, UA: NEGATIVE
Ketones, UA: NEGATIVE
Nitrite, UA: NEGATIVE
Protein, UA: NEGATIVE
Spec Grav, UA: 1.01 (ref 1.010–1.025)
Urobilinogen, UA: 0.2 E.U./dL
pH, UA: 6.5 (ref 5.0–8.0)

## 2018-12-21 MED ORDER — FLUCONAZOLE 150 MG PO TABS: ORAL_TABLET | ORAL | 0 refills | Status: DC

## 2018-12-21 MED ORDER — NITROFURANTOIN MONOHYD MACRO 100 MG PO CAPS: 100.0000 mg | ORAL_CAPSULE | Freq: Two times a day (BID) | ORAL | 0 refills | Status: DC

## 2018-12-21 NOTE — Patient Instructions (Addendum)
Please follow up if symptoms do not improve or as needed.  I will release your lab results to you on your MyChart account with further instructions. Please reply with any questions.    Urinary Tract Infection, Adult  A urinary tract infection (UTI) is an infection of any part of the urinary tract. The urinary tract includes the kidneys, ureters, bladder, and urethra. These organs make, store, and get rid of urine in the body. Your health care provider may use other names to describe the infection. An upper UTI affects the ureters and kidneys (pyelonephritis). A lower UTI affects the bladder (cystitis) and urethra (urethritis). What are the causes? Most urinary tract infections are caused by bacteria in your genital area, around the entrance to your urinary tract (urethra). These bacteria grow and cause inflammation of your urinary tract. What increases the risk? You are more likely to develop this condition if:  You have a urinary catheter that stays in place (indwelling).  You are not able to control when you urinate or have a bowel movement (you have incontinence).  You are female and you: ? Use a spermicide or diaphragm for birth control. ? Have low estrogen levels. ? Are pregnant.  You have certain genes that increase your risk (genetics).  You are sexually active.  You take antibiotic medicines.  You have a condition that causes your flow of urine to slow down, such as: ? An enlarged prostate, if you are female. ? Blockage in your urethra (stricture). ? A kidney stone. ? A nerve condition that affects your bladder control (neurogenic bladder). ? Not getting enough to drink, or not urinating often.  You have certain medical conditions, such as: ? Diabetes. ? A weak disease-fighting system (immunesystem). ? Sickle cell disease. ? Gout. ? Spinal cord injury. What are the signs or symptoms? Symptoms of this condition include:  Needing to urinate right away  (urgently).  Frequent urination or passing small amounts of urine frequently.  Pain or burning with urination.  Blood in the urine.  Urine that smells bad or unusual.  Trouble urinating.  Cloudy urine.  Vaginal discharge, if you are female.  Pain in the abdomen or the lower back. You may also have:  Vomiting or a decreased appetite.  Confusion.  Irritability or tiredness.  A fever.  Diarrhea. The first symptom in older adults may be confusion. In some cases, they may not have any symptoms until the infection has worsened. How is this diagnosed? This condition is diagnosed based on your medical history and a physical exam. You may also have other tests, including:  Urine tests.  Blood tests.  Tests for sexually transmitted infections (STIs). If you have had more than one UTI, a cystoscopy or imaging studies may be done to determine the cause of the infections. How is this treated? Treatment for this condition includes:  Antibiotic medicine.  Over-the-counter medicines to treat discomfort.  Drinking enough water to stay hydrated. If you have frequent infections or have other conditions such as a kidney stone, you may need to see a health care provider who specializes in the urinary tract (urologist). In rare cases, urinary tract infections can cause sepsis. Sepsis is a life-threatening condition that occurs when the body responds to an infection. Sepsis is treated in the hospital with IV antibiotics, fluids, and other medicines. Follow these instructions at home:  Medicines  Take over-the-counter and prescription medicines only as told by your health care provider.  If you were prescribed an antibiotic  medicine, take it as told by your health care provider. Do not stop using the antibiotic even if you start to feel better. General instructions  Make sure you: ? Empty your bladder often and completely. Do not hold urine for long periods of time. ? Empty your  bladder after sex. ? Wipe from front to back after a bowel movement if you are female. Use each tissue one time when you wipe.  Drink enough fluid to keep your urine pale yellow.  Keep all follow-up visits as told by your health care provider. This is important. Contact a health care provider if:  Your symptoms do not get better after 1-2 days.  Your symptoms go away and then return. Get help right away if you have:  Severe pain in your back or your lower abdomen.  A fever.  Nausea or vomiting. Summary  A urinary tract infection (UTI) is an infection of any part of the urinary tract, which includes the kidneys, ureters, bladder, and urethra.  Most urinary tract infections are caused by bacteria in your genital area, around the entrance to your urinary tract (urethra).  Treatment for this condition often includes antibiotic medicines.  If you were prescribed an antibiotic medicine, take it as told by your health care provider. Do not stop using the antibiotic even if you start to feel better.  Keep all follow-up visits as told by your health care provider. This is important. This information is not intended to replace advice given to you by your health care provider. Make sure you discuss any questions you have with your health care provider. Document Released: 12/01/2004 Document Revised: 02/08/2018 Document Reviewed: 08/31/2017 Elsevier Patient Education  2020 [employer].

## 2018-12-21 NOTE — Progress Notes (Signed)
Subjective   CC:  Chief Complaint  Patient presents with  . Painful Urination    Started (10/8) after having intercourse, pain is not a burning but more of muscle pain. Reports she is having urgency to empty bladder and some bleeding (may be cycle related)  . STD testing    HPI: Tara Merritt is a 23 y.o. female who presents to the office today to address the problems listed above in the chief complaint.  Patient reports dysuria and urinary frequency and lower pelvic pain described as throbbing pain. Came on suddenly 2 days after intercourse, new partner. Since, with persistent sxs mostly associated with voiding. Minimal vag itchng. No vaginal discharge.  She has sensation of increased urinary pressure.  She denies fevers flank pain nausea vomiting or gross hematuria.  Symptoms have been present for several days.  She denies history of interstitial cystitis.  She denies painful intercourse. Has lichen sclerosis so vaginal irritation sometimes is from that. Would like std screen as well since new partner.  Assessment  1. Pelvic pain   2. Dysuria   3. Screen for STD (sexually transmitted disease)      Plan   Acute cystitis: Educated and reassured.  Treat with Macrobid 5 days.  Diflucan for yeast infection if develops.  STD screening.  Continue safe sex.  Follow-up if unimproved  Follow up: Return if symptoms worsen or fail to improve.  Orders Placed This Encounter  Procedures  . Urine Culture  . HIV antibody (with reflex)  . RPR  . POCT urinalysis dipstick   Meds ordered this encounter  Medications  . nitrofurantoin, macrocrystal-monohydrate, (MACROBID) 100 MG capsule    Sig: Take 1 capsule (100 mg total) by mouth 2 (two) times daily.    Dispense:  10 capsule    Refill:  0  . fluconazole (DIFLUCAN) 150 MG tablet    Sig: Take one tablet today; may repeat in 3 days if symptoms persist    Dispense:  2 tablet    Refill:  0      I reviewed the patients updated PMH, FH, and  SocHx.    Patient Active Problem List   Diagnosis Date Noted  . 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 Oral contraceptive use 04/03/2018  . Lichen sclerosus 04/03/2018  . 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 clobetasol ointment (TEMOVATE) 0.05 % Apply 1 application topically 2 (two) times a week.  . drospirenone-ethinyl estradiol (YAZ,GIANVI,LORYNA) 3-0.02 MG tablet Take 1 tablet by mouth daily.  . Multiple Vitamin (MULTIVITAMIN WITH MINERALS) TABS tablet Take 1 tablet by mouth daily.    Review of Systems: Cardiovascular: negative for chest pain Respiratory: negative for SOB or persistent cough Gastrointestinal: negative for abdominal pain Constitutional: Negative for fever malaise or anorexia  Objective  Vitals: BP 106/68   Pulse 72   Temp 98.4 F (36.9 C) (Tympanic)   Resp 14   Ht 5' (1.524 m)   Wt 107 lb 6.4 oz (48.7 kg)   LMP 12/18/2018   SpO2 96%   BMI 20.98 kg/m  General: no acute distress  Psych:  Alert and oriented, normal mood and affect Cardiovascular:  RRR without murmur or gallop. no peripheral edema Respiratory:  Good breath sounds bilaterally, CTAB with normal respiratory effort Gastrointestinal: soft, flat abdomen, normal active bowel sounds, no palpable masses, no hepatosplenomegaly, no appreciated hernias, NO CVAT, mild suprapubic ttp w/o rebound or guarding Skin:  Warm, no rashes Neurologic:   Mental status  is normal. normal gait Office Visit on 12/21/2018  Component Date Value Ref Range Status  . Color, UA 12/21/2018 Yellow   Final  . Clarity, UA 12/21/2018 Clear   Final  . Glucose, UA 12/21/2018 Negative  Negative Final  . Bilirubin, UA 12/21/2018 Negative   Final  . Ketones, UA 12/21/2018 Negative   Final  . Spec Grav, UA 12/21/2018 1.010  1.010 - 1.025 Final  . Blood, UA 12/21/2018 Positive   Final  . pH, UA 12/21/2018 6.5  5.0 - 8.0 Final  . Protein, UA 12/21/2018 Negative  Negative Final  . Urobilinogen, UA 12/21/2018 0.2   0.2 or 1.0 E.U./dL Final  . Nitrite, UA 12/21/2018 Negative   Final  . Leukocytes, UA 12/21/2018 Trace* Negative Final    Commons side effects, risks, benefits, and alternatives for medications and treatment plan prescribed today were discussed, and the patient expressed understanding of the given instructions. Patient is instructed to call or message via MyChart if he/she has any questions or concerns regarding our treatment plan. No barriers to understanding were identified. We discussed Red Flag symptoms and signs in detail. Patient expressed understanding regarding what to do in case of urgent or emergency type symptoms.   Medication list was reconciled, printed and provided to the patient in AVS. Patient instructions and summary information was reviewed with the patient as documented in the AVS. This note was prepared with assistance of Dragon voice recognition software. Occasional wrong-word or sound-a-like substitutions may have occurred due to the inherent limitations of voice recognition software

## 2018-12-24 LAB — URINE CULTURE
MICRO NUMBER:: 998540
SPECIMEN QUALITY:: ADEQUATE

## 2018-12-24 LAB — HIV ANTIBODY (ROUTINE TESTING W REFLEX): HIV 1&2 Ab, 4th Generation: NONREACTIVE

## 2018-12-24 LAB — RPR: RPR Ser Ql: NONREACTIVE

## 2018-12-27 LAB — URINE CYTOLOGY ANCILLARY ONLY
Bacterial Vaginitis-Urine: NEGATIVE
Candida Urine: NEGATIVE
Chlamydia: NEGATIVE
Comment: NEGATIVE
Comment: NEGATIVE
Comment: NORMAL
Neisseria Gonorrhea: NEGATIVE
Trichomonas: NEGATIVE

## 2019-03-12 ENCOUNTER — Other Ambulatory Visit: Payer: Self-pay

## 2019-03-13 ENCOUNTER — Ambulatory Visit: Payer: 59 | Admitting: Family Medicine

## 2019-04-22 ENCOUNTER — Ambulatory Visit (INDEPENDENT_AMBULATORY_CARE_PROVIDER_SITE_OTHER): Payer: 59 | Admitting: Family Medicine

## 2019-04-22 ENCOUNTER — Other Ambulatory Visit: Payer: Self-pay

## 2019-04-22 ENCOUNTER — Telehealth: Payer: Self-pay

## 2019-04-22 ENCOUNTER — Encounter: Payer: Self-pay | Admitting: Family Medicine

## 2019-04-22 VITALS — BP 118/72 | HR 67 | Temp 97.6°F | Ht 60.0 in | Wt 105.6 lb

## 2019-04-22 DIAGNOSIS — L900 Lichen sclerosus et atrophicus: Secondary | ICD-10-CM | POA: Diagnosis not present

## 2019-04-22 DIAGNOSIS — L239 Allergic contact dermatitis, unspecified cause: Secondary | ICD-10-CM

## 2019-04-22 DIAGNOSIS — Z3041 Encounter for surveillance of contraceptive pills: Secondary | ICD-10-CM

## 2019-04-22 DIAGNOSIS — D75A Glucose-6-phosphate dehydrogenase (G6PD) deficiency without anemia: Secondary | ICD-10-CM

## 2019-04-22 DIAGNOSIS — Z0000 Encounter for general adult medical examination without abnormal findings: Secondary | ICD-10-CM | POA: Diagnosis not present

## 2019-04-22 DIAGNOSIS — N6011 Diffuse cystic mastopathy of right breast: Secondary | ICD-10-CM

## 2019-04-22 DIAGNOSIS — N6012 Diffuse cystic mastopathy of left breast: Secondary | ICD-10-CM

## 2019-04-22 LAB — CBC WITH DIFFERENTIAL/PLATELET
Basophils Absolute: 0 10*3/uL (ref 0.0–0.1)
Basophils Relative: 0.6 % (ref 0.0–3.0)
Eosinophils Absolute: 0.2 10*3/uL (ref 0.0–0.7)
Eosinophils Relative: 2.4 % (ref 0.0–5.0)
HCT: 39 % (ref 36.0–46.0)
Hemoglobin: 12.6 g/dL (ref 12.0–15.0)
Lymphocytes Relative: 35 % (ref 12.0–46.0)
Lymphs Abs: 2.3 10*3/uL (ref 0.7–4.0)
MCHC: 32.2 g/dL (ref 30.0–36.0)
MCV: 88.3 fl (ref 78.0–100.0)
Monocytes Absolute: 0.6 10*3/uL (ref 0.1–1.0)
Monocytes Relative: 9.1 % (ref 3.0–12.0)
Neutro Abs: 3.5 10*3/uL (ref 1.4–7.7)
Neutrophils Relative %: 52.9 % (ref 43.0–77.0)
Platelets: 214 10*3/uL (ref 150.0–400.0)
RBC: 4.42 Mil/uL (ref 3.87–5.11)
RDW: 12.7 % (ref 11.5–15.5)
WBC: 6.7 10*3/uL (ref 4.0–10.5)

## 2019-04-22 LAB — COMPREHENSIVE METABOLIC PANEL
ALT: 10 U/L (ref 0–35)
AST: 13 U/L (ref 0–37)
Albumin: 4 g/dL (ref 3.5–5.2)
Alkaline Phosphatase: 53 U/L (ref 39–117)
BUN: 13 mg/dL (ref 6–23)
CO2: 26 mEq/L (ref 19–32)
Calcium: 9.2 mg/dL (ref 8.4–10.5)
Chloride: 108 mEq/L (ref 96–112)
Creatinine, Ser: 0.68 mg/dL (ref 0.40–1.20)
GFR: 105.23 mL/min (ref >60.00)
Glucose, Bld: 80 mg/dL (ref 70–99)
Potassium: 4.3 mEq/L (ref 3.5–5.1)
Sodium: 140 mEq/L (ref 135–145)
Total Bilirubin: 0.4 mg/dL (ref 0.2–1.2)
Total Protein: 6.6 g/dL (ref 6.0–8.3)

## 2019-04-22 MED ORDER — CLOBETASOL PROPIONATE 0.05 % EX OINT: 1.0000 "application " | TOPICAL_OINTMENT | Freq: Two times a day (BID) | CUTANEOUS | 1 refills | Status: DC | PRN

## 2019-04-22 NOTE — Telephone Encounter (Signed)
The increase in cost may be due to a deductible in her pharmacy prescription plan? She may get a coupon from [facility] for an 80% discount.  OR she can look up on her formulary for other steroid ointments in that class that are cheaper.   Thanks

## 2019-04-22 NOTE — Patient Instructions (Signed)
Please return in 12 months for your annual complete physical; please come fasting.  I will release your lab results to you on your MyChart account with further instructions. Please reply with any questions.   If you have any questions or concerns, please don't hesitate to send me a message via MyChart or call the office at [phone]. Thank you for visiting with us today! It's our pleasure caring for you.  Please do these things to maintain good health!   Exercise at least 30-45 minutes a day,  4-5 days a week.   Eat a low-fat diet with lots of fruits and vegetables, up to 7-9 servings per day.  Drink plenty of water daily. Try to drink 8 8oz glasses per day.  Seatbelts can save your life. Always wear your seatbelt.  Place Smoke Detectors on every level of your home and check batteries every year.  Schedule an appointment with an eye doctor for an eye exam every 1-2 years  Safe sex - use condoms to protect yourself from STDs if you could be exposed to these types of infections. Use birth control if you do not want to become pregnant and are sexually active.  Avoid heavy alcohol use. If you drink, keep it to less than 2 drinks/day and not every day.  Health Care Power of Attorney.  Choose someone you trust that could speak for you if you became unable to speak for yourself.  Depression is common in our stressful world.If you're feeling down or losing interest in things you normally enjoy, please come in for a visit.  If anyone is threatening or hurting you, please get help. Physical or Emotional Violence is never OK.   

## 2019-04-22 NOTE — Telephone Encounter (Signed)
Patient notified that she can get up to 80% discounted off form a coupon from the [employer] website or she can look up her formulary for other steroid ointments in that class that are cheaper.

## 2019-04-22 NOTE — Progress Notes (Signed)
Subjective  Chief Complaint  Patient presents with  . Annual Exam    not fasting. recurring skin rash    HPI: Tara Merritt is a 24 y.o. female who presents to [facility] [place] [facility] today for a Female Wellness Visit.  She also has the concerns and/or needs as listed above in the chief complaint. These will be addressed in addition to the Health Maintenance Visit.   Wellness Visit: annual visit with health maintenance review and exam without Pap   HM: up to date. Nonfasting. Feeling well.working, in a stable relationship. STD screens up to date and negative. On OCPs. Chronic disease management visit and/or acute problem visit:  Itching: dry skin, atopic, and stress related.   Lichen sclerosus: remains well controlled with rare intermittent need for steroid use. Needs refill.   Assessment  1. Annual physical exam   2. Oral contraceptive use   3. G6PD deficiency   4. Lichen sclerosus   5. Allergic dermatitis   6. Fibrocystic breast changes, bilateral      Plan  Female Wellness Visit:  Age appropriate Health Maintenance and Prevention measures were discussed with patient. Included topics are cancer screening recommendations, ways to keep healthy (see AVS) including dietary and exercise recommendations, regular eye and dental care, use of seat belts, and avoidance of moderate alcohol use and tobacco use.   BMI: discussed patient's BMI and encouraged positive lifestyle modifications to help get to or maintain a target BMI.  HM needs and immunizations were addressed and ordered. See below for orders. See HM and immunization section for updates.  Routine labs and screening tests ordered including cmp, cbc and lipids where appropriate.  Discussed recommendations regarding Vit D and calcium supplementation (see AVS)  Chronic disease f/u and/or acute problem visit: (deemed necessary to be done in addition to the wellness visit):  itching:  In part stress related;  discussed skin care,antihistamine as needed and moisturizers  LS: stable. Refilled clobetasol  OCP use  Follow up: Return in about 1 year (around 04/21/2020) for complete physical.   Orders Placed This Encounter  Procedures  . CBC with Differential/Platelet  . Comprehensive metabolic panel   Meds ordered this encounter  Medications  . clobetasol ointment (TEMOVATE) 0.05 %    Sig: Apply 1 application topically 2 (two) times daily as needed.    Dispense:  30 g    Refill:  1      Lifestyle: Body mass index is 20.62 kg/m. Wt Readings from Last 3 Encounters:  04/22/19 105 lb 9.6 oz (47.9 kg)  12/21/18 107 lb 6.4 oz (48.7 kg)  04/18/18 102 lb 3.2 oz (46.4 kg)    Patient Active Problem List   Diagnosis Date Noted  . Fibrocystic breast changes, bilateral 04/22/2019  . 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 Oral contraceptive use 04/03/2018  . Lichen sclerosus 42/35/3614    On clobetasol   . 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 PAP-Cervical Cytology Screening  04/18/2021  . PAP SMEAR-Modifier  04/18/2021  . TETANUS/TDAP  11/26/2028  . INFLUENZA VACCINE  Completed  . HIV Screening  Completed   Immunization History  Administered Date(s) Administered  . Influenza Inj Mdck Quad Pf 11/23/2018  . Influenza,inj,Quad PF,6+ Mos 11/23/2018  . Tdap 11/27/2018   We updated and reviewed the patient's past history in detail and it is documented below. Allergies: Patient  reports current alcohol use. Past Medical History Patient  has a past medical history  of Atopic dermatitis (04/03/2018), G6PD deficiency (04/03/2018), Lichen sclerosus (04/03/2018), and Migraine. Past Surgical History Patient  has a past surgical history that includes Wisdom tooth extraction (2013). Social History   Socioeconomic History  . Marital status: Single    Spouse name: Not on file  . Number of children: Not on file  . Years of education: Not on file  . Highest education  level: Not on file  Occupational History  . Not on file  Tobacco Use  . Smoking status: Never Smoker  . Smokeless tobacco: Never Used  Substance and Sexual Activity  . Alcohol use: Yes  . Drug use: Never  . Sexual activity: Yes  Other Topics Concern  . Not on file  Social History Narrative  . Not on file   Social Determinants of Health   Financial Resource Strain:   . Difficulty of Paying Living Expenses: Not on file  Food Insecurity:   . Worried About Programme researcher, broadcasting/film/video in the Last Year: Not on file  . Ran Out of Food in the Last Year: Not on file  Transportation Needs:   . Lack of Transportation (Medical): Not on file  . Lack of Transportation (Non-Medical): Not on file  Physical Activity:   . Days of Exercise per Week: Not on file  . Minutes of Exercise per Session: Not on file  Stress:   . Feeling of Stress : Not on file  Social Connections:   . Frequency of Communication with Friends and Family: Not on file  . Frequency of Social Gatherings with Friends and Family: Not on file  . Attends Religious Services: Not on file  . Active Member of Clubs or Organizations: Not on file  . Attends Banker Meetings: Not on file  . Marital Status: Not on file   Family History  Adopted: Yes  Problem Relation Age of Onset  . Healthy Mother   . Healthy Father     Review of Systems: Constitutional: negative for fever or malaise Ophthalmic: negative for photophobia, double vision or loss of vision Cardiovascular: negative for chest pain, dyspnea on exertion, or new LE swelling Respiratory: negative for SOB or persistent cough Gastrointestinal: negative for abdominal pain, change in bowel habits or melena Genitourinary: negative for dysuria or gross hematuria, no abnormal uterine bleeding or disharge Musculoskeletal: negative for new gait disturbance or muscular weakness Integumentary: negative for new or persistent rashes, no breast lumps Neurological: negative  for TIA or stroke symptoms Psychiatric: negative for SI or delusions Allergic/Immunologic: negative for hives  Patient Care Team    Relationship Specialty Notifications Start End  [name], MD PCP - General Family Medicine  04/03/18     Objective  Vitals: BP 118/72 (BP Location: Left Arm, Patient Position: Sitting, Cuff Size: Normal)   Pulse 67   Temp 97.6 F (36.4 C) (Temporal)   Ht 5' (1.524 m)   Wt 105 lb 9.6 oz (47.9 kg)   LMP 04/11/2019 (Approximate)   SpO2 97%   BMI 20.62 kg/m  General:  Well developed, well nourished, no acute distress  Psych:  Alert and orientedx3,normal mood and affect HEENT:  Normocephalic, atraumatic, non-icteric sclera, PERRL, oropharynx is clear without mass or exudate, supple neck without adenopathy, mass or thyromegaly Cardiovascular:  Normal S1, S2, RRR without gallop, rub or murmur, nondisplaced PMI Respiratory:  Good breath sounds bilaterally, CTAB with normal respiratory effort Gastrointestinal: normal bowel sounds, soft, non-tender, no noted masses. No HSM MSK: no deformities, contusions. Joints are  without erythema or swelling. Spine and CVA region are nontender Skin:  Warm, no rashes or suspicious lesions noted, multiple scars on back and torso from scratching picking. No active lesions. No redness or irritation or flaking.  Neurologic:    Mental status is normal. CN 2-11 are normal. Gross motor and sensory exams are normal. Normal gait. No tremor Breast Exam: No mass, skin retraction or nipple discharge is appreciated in either breast. No axillary adenopathy. Fibrocystic changes are not noted .     Commons side effects, risks, benefits, and alternatives for medications and treatment plan prescribed today were discussed, and the patient expressed understanding of the given instructions. Patient is instructed to call or message via MyChart if he/she has any questions or concerns regarding our treatment plan. No barriers to understanding  were identified. We discussed Red Flag symptoms and signs in detail. Patient expressed understanding regarding what to do in case of urgent or emergency type symptoms.   Medication list was reconciled, printed and provided to the patient in AVS. Patient instructions and summary information was reviewed with the patient as documented in the AVS. This note was prepared with assistance of Dragon voice recognition software. Occasional wrong-word or sound-a-like substitutions may have occurred due to the inherent limitations of voice recognition software  This visit occurred during the SARS-CoV-2 public health emergency.  Safety protocols were in place, including screening questions prior to the visit, additional usage of staff PPE, and extensive cleaning of exam room while observing appropriate contact time as indicated for disinfecting solutions.

## 2019-04-22 NOTE — Telephone Encounter (Signed)
Please advise 

## 2019-04-22 NOTE — Telephone Encounter (Signed)
Patient calling about medication that was sent in today. Pharmacy told patient that it $100. Patient would like to know if she would like know if there are other options.

## 2019-08-19 ENCOUNTER — Encounter: Payer: Self-pay | Admitting: Family Medicine

## 2019-09-06 ENCOUNTER — Ambulatory Visit (INDEPENDENT_AMBULATORY_CARE_PROVIDER_SITE_OTHER): Payer: 59 | Admitting: Family Medicine

## 2019-09-06 ENCOUNTER — Encounter: Payer: Self-pay | Admitting: Family Medicine

## 2019-09-06 ENCOUNTER — Other Ambulatory Visit: Payer: Self-pay

## 2019-09-06 VITALS — BP 110/70 | HR 60 | Temp 98.6°F | Ht 60.0 in | Wt 100.0 lb

## 2019-09-06 DIAGNOSIS — F430 Acute stress reaction: Secondary | ICD-10-CM

## 2019-09-06 NOTE — Patient Instructions (Addendum)
Please follow up if symptoms do not improve or as needed.   Your next physical is due in February 2022.   Consider getting a gynecologist to help you with the lichen sclerosus.   Please call [facility] to schedule an appointment with Dr. Colen Darling; she is a therapist here at our [place] office.  The phone number is: [phone]

## 2019-09-10 NOTE — Progress Notes (Signed)
Subjective  CC:  Chief Complaint  Patient presents with  . Anxiety    increased stress would like referral    HPI: Tara Merritt is a 24 y.o. female who presents to the office today to address the problems listed above in the chief complaint, mood problems.  24 year old high functioning female with plan to start counseling.  She is struggling with where she is at this time in her life.  She reports that she was adopted and her adopted family was somewhat distant and very task oriented and driven.  She has felt supported but she was hard on herself.  She is always been goal oriented.  She went to college and post graduate school 16 well.  She has an excellent job now.  She now worries about what she will do next and if she stopped.  This causes her anxiety.  Having problems sleeping and also understands that she will go to work on her emotional growth.  She is in a relationship and knows that she tends to be closed off.  No symptoms on major depressive disorder.  No history of mood problems. Depression screen [facility] 2/9 09/06/2019 04/22/2019 04/03/2018  Decreased Interest 0 0 0  Down, Depressed, Hopeless 1 0 0  PHQ - 2 Score 1 0 0  Altered sleeping 0 - -  Tired, decreased energy 2 - -  Change in appetite 0 - -  Feeling bad or failure about yourself  0 - -  Trouble concentrating 0 - -  Moving slowly or fidgety/restless 0 - -  Suicidal thoughts 0 - -  PHQ-9 Score 3 - -  Difficult doing work/chores Not difficult at all - -   GAD 7 : Generalized Anxiety Score 09/06/2019  Nervous, Anxious, on Edge 2  Control/stop worrying 2  Worry too much - different things 3  Trouble relaxing 2  Restless 0  Easily annoyed or irritable 0  Afraid - awful might happen 0  Total GAD 7 Score 9  Anxiety Difficulty Not difficult at all     Assessment  1. Stress reaction      Plan    I feel that counseling would be an excellent idea.  We started the conversation in the office today.  I spent 30 minutes  with this patient.  Recommend setting up appointment for psychotherapy. Follow up: As needed No orders of the defined types were placed in this encounter.  No orders of the defined types were placed in this encounter.     I reviewed the patients updated PMH, FH, and SocHx.    Patient Active Problem List   Diagnosis Date Noted  . Fibrocystic breast changes, bilateral 04/22/2019  . 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 Oral contraceptive use 04/03/2018  . Lichen sclerosus 04/03/2018  . 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 clobetasol ointment (TEMOVATE) 0.05 % Apply 1 application topically 2 (two) times daily as needed.  . Multiple Vitamin (MULTIVITAMIN WITH MINERALS) TABS tablet Take 1 tablet by mouth daily.    Allergies: Patient has No Known Allergies. Family history:  Patient family history includes Healthy in her father and mother. She was adopted. Social History   Socioeconomic History  . Marital status: Single    Spouse name: Not on file  . Number of children: Not on file  . Years of education: Not on file  . Highest education level: Not on file  Occupational History  . Not on file  Tobacco Use  .  Smoking status: Never Smoker  . Smokeless tobacco: Never Used  Substance and Sexual Activity  . Alcohol use: Yes  . Drug use: Never  . Sexual activity: Yes  Other Topics Concern  . Not on file  Social History Narrative  . Not on file   Social Determinants of Health   Financial Resource Strain:   . Difficulty of Paying Living Expenses:   Food Insecurity:   . Worried About Programme researcher, broadcasting/film/video in the Last Year:   . Barista in the Last Year:   Transportation Needs:   . Freight forwarder (Medical):   Marland Kitchen Lack of Transportation (Non-Medical):   Physical Activity:   . Days of Exercise per Week:   . Minutes of Exercise per Session:   Stress:   . Feeling of Stress :   Social Connections:   . Frequency of Communication with Friends  and Family:   . Frequency of Social Gatherings with Friends and Family:   . Attends Religious Services:   . Active Member of Clubs or Organizations:   . Attends Banker Meetings:   Marland Kitchen Marital Status:      Review of Systems: Constitutional: Negative for fever malaise or anorexia Cardiovascular: negative for chest pain Respiratory: negative for SOB or persistent cough Gastrointestinal: negative for abdominal pain  Objective  Vitals: BP 110/70   Pulse 60   Temp 98.6 F (37 C) (Temporal)   Ht 5' (1.524 m)   Wt 100 lb (45.4 kg)   SpO2 98%   BMI 19.53 kg/m  General: no acute distress, well appearing, no apparent distress, well groomed Psych:  Alert and oriented x 3,normal mood, behavior, speech, dress, and thought processes.     Commons side effects, risks, benefits, and alternatives for medications and treatment plan prescribed today were discussed, and the patient expressed understanding of the given instructions. Patient is instructed to call or message via MyChart if he/she has any questions or concerns regarding our treatment plan. No barriers to understanding were identified. We discussed Red Flag symptoms and signs in detail. Patient expressed understanding regarding what to do in case of urgent or emergency type symptoms.   Medication list was reconciled, printed and provided to the patient in AVS. Patient instructions and summary information was reviewed with the patient as documented in the AVS. This note was prepared with assistance of Dragon voice recognition software. Occasional wrong-word or sound-a-like substitutions may have occurred due to the inherent limitations of voice recognition software

## 2019-10-23 LAB — RESULTS CONSOLE HPV: CHL HPV: POSITIVE

## 2019-10-23 LAB — HM PAP SMEAR: HM Pap smear: NEGATIVE

## 2019-10-29 ENCOUNTER — Telehealth: Payer: Self-pay

## 2019-10-29 NOTE — Telephone Encounter (Signed)
Pt has questions about labs. 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 She wants to know if she was tested for this 6 months ago.

## 2019-10-29 NOTE — Telephone Encounter (Signed)
FYI  Patient aware that last pap performed in our office on 04/18/2018 was negative for HPV.

## 2019-10-30 ENCOUNTER — Ambulatory Visit (INDEPENDENT_AMBULATORY_CARE_PROVIDER_SITE_OTHER): Payer: 59 | Admitting: Psychology

## 2019-10-30 DIAGNOSIS — F4323 Adjustment disorder with mixed anxiety and depressed mood: Secondary | ICD-10-CM

## 2019-11-12 ENCOUNTER — Ambulatory Visit (INDEPENDENT_AMBULATORY_CARE_PROVIDER_SITE_OTHER): Payer: 59 | Admitting: Psychology

## 2019-11-12 DIAGNOSIS — F4323 Adjustment disorder with mixed anxiety and depressed mood: Secondary | ICD-10-CM

## 2019-11-25 ENCOUNTER — Ambulatory Visit (INDEPENDENT_AMBULATORY_CARE_PROVIDER_SITE_OTHER): Payer: 59 | Admitting: Psychology

## 2019-11-25 DIAGNOSIS — F4323 Adjustment disorder with mixed anxiety and depressed mood: Secondary | ICD-10-CM

## 2019-12-16 ENCOUNTER — Ambulatory Visit (INDEPENDENT_AMBULATORY_CARE_PROVIDER_SITE_OTHER): Payer: 59 | Admitting: Psychology

## 2019-12-16 DIAGNOSIS — F4323 Adjustment disorder with mixed anxiety and depressed mood: Secondary | ICD-10-CM

## 2019-12-30 ENCOUNTER — Ambulatory Visit (INDEPENDENT_AMBULATORY_CARE_PROVIDER_SITE_OTHER): Payer: 59 | Admitting: Psychology

## 2019-12-30 DIAGNOSIS — F4323 Adjustment disorder with mixed anxiety and depressed mood: Secondary | ICD-10-CM

## 2020-01-14 ENCOUNTER — Ambulatory Visit: Payer: 59 | Admitting: Psychology

## 2020-01-14 ENCOUNTER — Ambulatory Visit (INDEPENDENT_AMBULATORY_CARE_PROVIDER_SITE_OTHER): Payer: 59 | Admitting: Psychology

## 2020-01-14 DIAGNOSIS — F4323 Adjustment disorder with mixed anxiety and depressed mood: Secondary | ICD-10-CM

## 2020-01-29 ENCOUNTER — Ambulatory Visit (INDEPENDENT_AMBULATORY_CARE_PROVIDER_SITE_OTHER): Payer: 59 | Admitting: Psychology

## 2020-01-29 ENCOUNTER — Ambulatory Visit: Payer: 59 | Admitting: Psychology

## 2020-01-29 DIAGNOSIS — F4323 Adjustment disorder with mixed anxiety and depressed mood: Secondary | ICD-10-CM | POA: Diagnosis not present

## 2020-02-03 ENCOUNTER — Other Ambulatory Visit: Payer: Self-pay

## 2020-02-03 ENCOUNTER — Encounter: Payer: Self-pay | Admitting: Physician Assistant

## 2020-02-03 ENCOUNTER — Ambulatory Visit (INDEPENDENT_AMBULATORY_CARE_PROVIDER_SITE_OTHER): Payer: 59 | Admitting: Physician Assistant

## 2020-02-03 VITALS — BP 92/60 | HR 60 | Temp 98.1°F | Ht 60.0 in | Wt 101.0 lb

## 2020-02-03 DIAGNOSIS — R059 Cough, unspecified: Secondary | ICD-10-CM

## 2020-02-03 MED ORDER — PREDNISONE 20 MG PO TABS: 40.0000 mg | ORAL_TABLET | Freq: Every day | ORAL | 0 refills | Status: DC

## 2020-02-03 MED ORDER — DOXYCYCLINE HYCLATE 100 MG PO TABS: 100.0000 mg | ORAL_TABLET | Freq: Two times a day (BID) | ORAL | 0 refills | Status: DC

## 2020-02-03 NOTE — Progress Notes (Signed)
Tara Merritt is a 24 y.o. female here for a new problem.  I acted as a Neurosurgeon for [employer], PA-C Corky Mull, LPN   History of Present Illness:   Chief Complaint  Patient presents with  . Cough  . Sore Throat    HPI   Cough Pt c/o cough x 5 days, coughing and expectorating green/yellow sputum, sometimes blood stained. Pt did have a sore throat but that has subsided. No fever but had chills over the weekend. She is having fatigue.  She has tried Dayquil, Mucinex 12 hr yesterday. She took two COVID tests at home over the past few days, both tests were negative.  Denies: fever, SOB, severe headache, chest pain  Patient's last menstrual period was 01/05/2020. Denies concerns for pregnancy.  Denies sick contacts.   Past Medical History:  Diagnosis Date  . Atopic dermatitis 04/03/2018  . 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 Lichen sclerosus 04/03/2018  . Migraine      Social History   Tobacco Use  . Smoking status: Never Smoker  . Smokeless tobacco: Never Used  Substance Use Topics  . Alcohol use: Yes  . Drug use: Never    Past Surgical History:  Procedure Laterality Date  . WISDOM TOOTH EXTRACTION  2013    Family History  Adopted: Yes  Problem Relation Age of Onset  . Healthy Mother   . Healthy Father     No Known Allergies  Current Medications:   Current Outpatient Medications:  .  clobetasol ointment (TEMOVATE) 0.05 %, Apply 1 application topically 2 (two) times daily as needed., Disp: 30 g, Rfl: 1 .  Multiple Vitamin (MULTIVITAMIN WITH MINERALS) TABS tablet, Take 1 tablet by mouth daily., Disp: , Rfl:  .  doxycycline (VIBRA-TABS) 100 MG tablet, Take 1 tablet (100 mg total) by mouth 2 (two) times daily., Disp: 20 tablet, Rfl: 0 .  predniSONE (DELTASONE) 20 MG tablet, Take 2 tablets (40 mg total) by mouth daily., Disp: 10 tablet, Rfl: 0   Review of Systems:   ROS Negative unless otherwise specified per HPI.  Vitals:   Vitals:   02/03/20 1001   BP: 92/60  Pulse: 60  Temp: 98.1 F (36.7 C)  TempSrc: Temporal  SpO2: 97%  Weight: 101 lb (45.8 kg)  Height: 5' (1.524 m)     Body mass index is 19.73 kg/m.  Physical Exam:   Physical Exam Vitals and nursing note reviewed.  Constitutional:      General: She is not in acute distress.    Appearance: She is well-developed. She is not ill-appearing or toxic-appearing.  HENT:     Head: Normocephalic and atraumatic.     Right Ear: Tympanic membrane, ear canal and external ear normal. Tympanic membrane is not erythematous, retracted or bulging.     Left Ear: Tympanic membrane, ear canal and external ear normal. Tympanic membrane is not erythematous, retracted or bulging.     Nose: Nose normal.     Right Sinus: No maxillary sinus tenderness or frontal sinus tenderness.     Left Sinus: No maxillary sinus tenderness or frontal sinus tenderness.     Mouth/Throat:     Pharynx: Uvula midline. No posterior oropharyngeal erythema.     Tonsils: No tonsillar exudate. 1+ on the right. 1+ on the left.  Eyes:     General: Lids are normal.     Conjunctiva/sclera: Conjunctivae normal.  Neck:     Trachea: Trachea normal.  Cardiovascular:     Rate and Rhythm:  Normal rate and regular rhythm.     Pulses: Normal pulses.     Heart sounds: Normal heart sounds, S1 normal and S2 normal.     Comments: No LE edema Pulmonary:     Effort: Pulmonary effort is normal.     Breath sounds: Normal breath sounds. No decreased breath sounds, wheezing, rhonchi or rales.  Lymphadenopathy:     Cervical: Cervical adenopathy present.  Skin:    General: Skin is warm and dry.  Neurological:     Mental Status: She is alert.     GCS: GCS eye subscore is 4. GCS verbal subscore is 5. GCS motor subscore is 6.  Psychiatric:        Speech: Speech normal.        Behavior: Behavior normal. Behavior is cooperative.     Assessment and Plan:   Tylee was seen today for cough and sore throat.  Diagnoses and all  orders for this visit:  Cough No red flags on exam. COVID test negative. Suspect possible bronchitis or sinusitis. Start oral doxycycline and prednisone. Follow-up if no improvement of symptoms or any worsening.  Other orders -     Discontinue: doxycycline (VIBRA-TABS) 100 MG tablet; Take 1 tablet (100 mg total) by mouth 2 (two) times daily. -     Discontinue: predniSONE (DELTASONE) 20 MG tablet; Take 2 tablets (40 mg total) by mouth daily. -     doxycycline (VIBRA-TABS) 100 MG tablet; Take 1 tablet (100 mg total) by mouth 2 (two) times daily. -     predniSONE (DELTASONE) 20 MG tablet; Take 2 tablets (40 mg total) by mouth daily.  CMA or LPN served as scribe during this visit. History, Physical, and Plan performed by medical provider. The above documentation has been reviewed and is accurate and complete.   Jarold Motto, PA-C

## 2020-02-03 NOTE — Patient Instructions (Signed)
It was great to see you!  Start oral doxycycline and prednisone for your cough.  Trial dextromethorphan (delsym) -- available over the counter. For your cough.  Push fluids and get plenty of rest. Please return if you are not improving as expected, or if you have high fevers (>101.5) or difficulty swallowing or worsening productive cough.  Call clinic with questions.  I hope you start feeling better soon!

## 2020-02-12 ENCOUNTER — Ambulatory Visit: Payer: 59 | Admitting: Psychology

## 2020-02-13 ENCOUNTER — Encounter: Payer: Self-pay | Admitting: Physician Assistant

## 2020-03-12 ENCOUNTER — Other Ambulatory Visit: Payer: Self-pay

## 2020-03-12 ENCOUNTER — Encounter: Payer: Self-pay | Admitting: Family Medicine

## 2020-03-12 ENCOUNTER — Other Ambulatory Visit (HOSPITAL_COMMUNITY)
Admission: RE | Admit: 2020-03-12 | Discharge: 2020-03-12 | Disposition: A | Discharge status: Home / Self Care | Payer: BC Managed Care – PPO | Source: Ambulatory Visit | Attending: Family Medicine | Admitting: Family Medicine

## 2020-03-12 ENCOUNTER — Ambulatory Visit (INDEPENDENT_AMBULATORY_CARE_PROVIDER_SITE_OTHER): Payer: BC Managed Care – PPO | Admitting: Family Medicine

## 2020-03-12 VITALS — BP 118/78 | HR 74 | Temp 98.3°F | Wt 97.2 lb

## 2020-03-12 DIAGNOSIS — B977 Papillomavirus as the cause of diseases classified elsewhere: Secondary | ICD-10-CM | POA: Insufficient documentation

## 2020-03-12 DIAGNOSIS — Z113 Encounter for screening for infections with a predominantly sexual mode of transmission: Secondary | ICD-10-CM | POA: Insufficient documentation

## 2020-03-12 NOTE — Patient Instructions (Signed)
Please return in August 2022 for complete physical with lab work and repeat pap smear.  I will release your lab results to you on your MyChart account with further instructions. Please reply with any questions.   I hope the information helps you feel better. You are well!  If you have any questions or concerns, please don't hesitate to send me a message via MyChart or call the office at [phone]. Thank you for visiting with [place] today! It's our pleasure caring for you.

## 2020-03-12 NOTE — Progress Notes (Signed)
Subjective  CC:  Chief Complaint  Patient presents with  . Abnormal Pap Smear    HPI: Tara Merritt is a 25 y.o. female who presents to the office today to address the problems listed above in the chief complaint.  August 2021 had pap done at [facility]: neg sil but HR HPV identified. Pt was not counseled and is upset. Feels she has STD, wants to know who gave it to her and wants other std screens done. No sxs. No known exposures. In monogamous relationship. Has had full hpv vaccine series completed. No h/o abnl paps. Last 2020 was normal. (wasn't due for repeat until 2023). No h/o STDs.    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 Screen for STD (sexually transmitted disease)      Plan   Cervical ca Ione with nl pap and HR HPV:  Counseling and education given. Reassured. Should resolve over time. Repeat pap in aug 2023 with co testing. Std screen discussed and ordered.   Follow up: aug 2023 for cpe with pap.   Visit date not found  Orders Placed This Encounter  Procedures  . HIV Antibody (routine testing w rflx)   No orders of the defined types were placed in this encounter.     I reviewed the patients updated PMH, FH, and SocHx.    Patient Active Problem List   Diagnosis Date Noted  . Fibrocystic breast changes, bilateral 04/22/2019  . 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 Lichen sclerosus 04/03/2018  . 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    Allergies: Patient has No Known Allergies. Family History: Patient family history includes Healthy in her father and mother. She was adopted. Social History:  Patient  reports that she has never smoked. She has never used smokeless tobacco. She reports current alcohol use. She reports that she does not use drugs.  Review of Systems: Constitutional: Negative for fever malaise or anorexia Cardiovascular: negative for  chest pain Respiratory: negative for SOB or persistent cough Gastrointestinal: negative for abdominal pain  Objective  Vitals: BP 118/78   Pulse 74   Temp 98.3 F (36.8 C) (Temporal)   Wt 97 lb 3.2 oz (44.1 kg)   LMP 02/03/2020 (Exact Date)   SpO2 98%   BMI 18.98 kg/m  General: no acute distress , A&Ox3 Psych: anxious    Commons side effects, risks, benefits, and alternatives for medications and treatment plan prescribed today were discussed, and the patient expressed understanding of the given instructions. Patient is instructed to call or message via MyChart if he/she has any questions or concerns regarding our treatment plan. No barriers to understanding were identified. We discussed Red Flag symptoms and signs in detail. Patient expressed understanding regarding what to do in case of urgent or emergency type symptoms.   Medication list was reconciled, printed and provided to the patient in AVS. Patient instructions and summary information was reviewed with the patient as documented in the AVS. This note was prepared with assistance of Dragon voice recognition software. Occasional wrong-word or sound-a-like substitutions may have occurred due to the inherent limitations of voice recognition software  This visit occurred during the SARS-CoV-2 public health emergency.  Safety protocols were in place, including screening questions prior to the visit, additional usage of staff PPE, and extensive cleaning of exam room while observing appropriate contact time as indicated for disinfecting solutions.

## 2020-03-13 LAB — URINE CYTOLOGY ANCILLARY ONLY
Chlamydia: NEGATIVE
Comment: NEGATIVE
Comment: NEGATIVE
Comment: NORMAL
Neisseria Gonorrhea: NEGATIVE
Trichomonas: NEGATIVE

## 2020-03-13 LAB — HIV ANTIBODY (ROUTINE TESTING W REFLEX): HIV 1&2 Ab, 4th Generation: NONREACTIVE

## 2020-03-17 ENCOUNTER — Encounter: Payer: Self-pay | Admitting: Family Medicine

## 2020-03-20 DIAGNOSIS — Z1152 Encounter for screening for COVID-19: Secondary | ICD-10-CM | POA: Diagnosis not present

## 2020-03-31 ENCOUNTER — Ambulatory Visit: Payer: BC Managed Care – PPO | Admitting: Family Medicine

## 2020-07-06 ENCOUNTER — Encounter: Payer: Self-pay | Admitting: Family Medicine

## 2020-07-06 ENCOUNTER — Other Ambulatory Visit (HOSPITAL_COMMUNITY)
Admission: RE | Admit: 2020-07-06 | Discharge: 2020-07-06 | Disposition: A | Discharge status: Home / Self Care | Payer: BC Managed Care – PPO | Source: Ambulatory Visit | Attending: Family Medicine | Admitting: Family Medicine

## 2020-07-06 ENCOUNTER — Ambulatory Visit (INDEPENDENT_AMBULATORY_CARE_PROVIDER_SITE_OTHER): Payer: BC Managed Care – PPO | Admitting: Family Medicine

## 2020-07-06 ENCOUNTER — Other Ambulatory Visit: Payer: Self-pay

## 2020-07-06 VITALS — BP 122/88 | HR 72 | Temp 98.7°F | Resp 15 | Ht 60.0 in | Wt 104.0 lb

## 2020-07-06 DIAGNOSIS — R102 Pelvic and perineal pain: Secondary | ICD-10-CM | POA: Insufficient documentation

## 2020-07-06 DIAGNOSIS — R300 Dysuria: Secondary | ICD-10-CM | POA: Diagnosis not present

## 2020-07-06 DIAGNOSIS — N949 Unspecified condition associated with female genital organs and menstrual cycle: Secondary | ICD-10-CM

## 2020-07-06 DIAGNOSIS — B977 Papillomavirus as the cause of diseases classified elsewhere: Secondary | ICD-10-CM | POA: Insufficient documentation

## 2020-07-06 LAB — POCT URINALYSIS DIPSTICK
Bilirubin, UA: NEGATIVE
Blood, UA: NEGATIVE
Glucose, UA: NEGATIVE
Ketones, UA: NEGATIVE
Leukocytes, UA: NEGATIVE
Nitrite, UA: NEGATIVE
Protein, UA: NEGATIVE
Spec Grav, UA: 1.015 (ref 1.010–1.025)
Urobilinogen, UA: 0.2 E.U./dL
pH, UA: 7.5 (ref 5.0–8.0)

## 2020-07-06 LAB — POCT URINE PREGNANCY: Preg Test, Ur: NEGATIVE

## 2020-07-06 MED ORDER — DOXYCYCLINE HYCLATE 100 MG PO TABS: 100.0000 mg | ORAL_TABLET | Freq: Two times a day (BID) | ORAL | 0 refills | Status: DC

## 2020-07-06 MED ORDER — METRONIDAZOLE 500 MG PO TABS: 500.0000 mg | ORAL_TABLET | Freq: Two times a day (BID) | ORAL | 0 refills | Status: AC

## 2020-07-06 NOTE — Patient Instructions (Signed)
Please follow up if symptoms do not improve or as needed.   I will release your lab results to you on your MyChart account with further instructions. Please reply with any questions.   If your pain worsens, please let me know. Use ibuprofen 2-3 tabs every 6 hours as needed for pain.  Start the antibiotics as ordered .  PID is a possible diagnosis, but other things can be causing your pain as well. We will see how you do on the antibiotics and what your blood and urine tests show. We will do more testing if needing.   Pelvic Inflammatory Disease  Pelvic inflammatory disease (PID) is an infection in some or all of the female reproductive organs. PID can be in the womb (uterus), ovaries, fallopian tubes, or nearby tissues that are inside the lower belly area (pelvis). PID can lead to problems if it is not treated. What are the causes?  Germs (bacteria) that are spread during sex. This is the most common cause.  Germs in the vagina that are not spread during sex.  Germs that travel up from the vagina or cervix to the reproductive organs after: ? The birth of a baby. ? A miscarriage. ? An abortion. ? Pelvic surgery. ? Insertion of an intrauterine device (IUD). ? A sexual assault. What increases the risk?  Being younger than 27 years old.  Having sex at a young age.  Having a history of STI (sexually transmitted infection) or PID.  Not using barrier birth control, such as condoms.  Having a lot of sex partners.  Having sex with someone who has symptoms of an STI.  Using a douche.  Having an IUD put in place. What are the signs or symptoms?  Pain in the belly area.  Fever.  Chills.  Discharge from the vagina that is not normal.  Bleeding from the womb that is not normal.  Pain soon after the end of a menstrual period.  Pain when you pee (urinate).  Pain with sex.  Feeling sick to your stomach (nauseous) or throwing up (vomiting). How is this  treated?  Antibiotic medicines. In very bad cases, these may be given through an IV tube.  Surgery. This is rare.  Efforts to stop the spread of the infection. Sex partners may need to be treated. It may take weeks until you feel all better. Your doctor may test you for infection again after you finish treatment. You should also be checked for HIV (human immunodeficiency virus). Follow these instructions at home:  Take over-the-counter and prescription medicines only as told by your doctor.  If you were prescribed an antibiotic medicine, take it as told by your doctor. Do not stop taking it even if you start to feel better.  Do not have sex until treatment is done or as told by your doctor.  Tell your sex partner if you have PID. Your partner may need to be treated.  Keep all follow-up visits as told by your doctor. This is important. Contact a doctor if:  You have more fluid or fluid that is not normal coming from your vagina.  Your pain does not improve.  You throw up.  You have a fever.  You cannot take your medicines.  Your partner has an STI.  You have pain when you pee. Get help right away if:  You have more pain in the belly area.  You have chills.  You are not better in 72 hours with treatment. Summary  Pelvic  inflammatory disease (PID) is caused by an infection in some or all of the female reproductive organs.  PID is a serious infection.  This infection is most often treated with antibiotics.  Do not have sex until treatment is done or as told by your doctor. This information is not intended to replace advice given to you by your health care provider. Make sure you discuss any questions you have with your health care provider. Document Revised: 11/09/2017 Document Reviewed: 11/15/2017 Elsevier Patient Education  2021 [employer].

## 2020-07-06 NOTE — Progress Notes (Signed)
Subjective   CC:  Chief Complaint  Patient presents with  . Urinary Tract Infection    Burning while urinating, abdominal pain. Burning stopped yesterday, abdominal pain started yesterday.    HPI: Tara Merritt is a 25 y.o. female who presents to the office today to address the problems listed above in the chief complaint. Patient reports dysuria and urinary frequency.  She has sensation of increased urinary pressure.  The symptoms started about a week ago and have been intermittent.  However the urine symptoms resolved yesterday with the abrupt onset of pelvic pain and pressure.  This morning pelvic pain is worse.  Worse with movement.  No associated fevers, chills or noted vaginal discharge.  She does report she had a sensation of vaginal discomfort over the last week described more as an irritation.  Possible mild itching without significant discharge.  She does admit to pain with intercourse recently.  She has regular menstrual cycles, her LMP was April 20 and was normal.  She is not currently on birth control.  Has had regular menstrual cycles.   Assessment  1. Pelvic pain   2. Dysuria   3. Vaginal discomfort      Plan   Possible PID: Empirically start doxycycline 100 twice daily and Flagyl 500 twice daily, await STD testing, BV and yeast swabs.  Will have pregnancy with urine pregnancy test today.  Check urine culture due to history of dysuria.  Check CBC and sed rate.  If pelvic pain worsens, patient to notify me.  Start ibuprofen 600 3 times daily.  Pelvic ultrasound if not improving over the next 48 hours.  Patient stands and agrees.  Follow up: As needed Orders Placed This Encounter  Procedures  . GC/Chlamydia Probe Amp  . POCT Urinalysis Dipstick   No orders of the defined types were placed in this encounter.     I reviewed the patients updated PMH, FH, and SocHx.    Patient Active Problem List   Diagnosis Date Noted  . Fibrocystic breast changes, bilateral  04/22/2019  . 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 Lichen sclerosus 04/03/2018  . 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 Prenatal Vit-Fe Fumarate-FA (PRENATAL PO) Take by mouth.  . triamcinolone (NASACORT ALLERGY 24HR) 55 MCG/ACT AERO nasal inhaler Place 2 sprays into the nose daily.  Marland Kitchen VITAMIN D PO Take by mouth daily.    Review of Systems: Cardiovascular: negative for chest pain Respiratory: negative for SOB or persistent cough Gastrointestinal: negative for abdominal pain Constitutional: Negative for fever malaise or anorexia  Objective  Vitals: BP 122/88   Pulse 72   Temp 98.7 F (37.1 C) (Temporal)   Resp 15   Ht 5' (1.524 m)   Wt 104 lb (47.2 kg)   LMP 06/24/2020 (Exact Date)   SpO2 98%   BMI 20.31 kg/m  General: no acute distress  Psych:  Alert and oriented, normal mood and affect Cardiovascular:  RRR without murmur or gallop. no peripheral edema Respiratory:  Good breath sounds bilaterally, CTAB with normal respiratory effort Gastrointestinal: soft, flat abdomen, normal active bowel sounds, no palpable masses, no hepatosplenomegaly, no appreciated hernias, NO CVAT, mild suprapubic ttp w/o rebound or guarding, soft quiet abdomen GYN: Normal introitus, normal vaginal mucosa without vaginal or vulvar lesions present, thin homogenous discharge present in the vault, normal-appearing cervix, mild cervical motion tenderness, tender fundus and left greater than right adnexa without masses palpated, no rebound or guarding  Office Visit on 07/06/2020  Component Date Value Ref Range Status  . Color, UA 07/06/2020 yellow   Final  . Clarity, UA 07/06/2020 clear   Final  . Glucose, UA 07/06/2020 Negative  Negative Final  . Bilirubin, UA 07/06/2020 negative   Final  . Ketones, UA 07/06/2020 negative   Final  . Spec Grav, UA 07/06/2020 1.015  1.010 - 1.025 Final  . Blood, UA 07/06/2020 negative   Final  . pH, UA 07/06/2020 7.5  5.0 - 8.0 Final  . Protein, UA  07/06/2020 Negative  Negative Final  . Urobilinogen, UA 07/06/2020 0.2  0.2 or 1.0 E.U./dL Final  . Nitrite, UA 54/62/7035 negative   Final  . Leukocytes, UA 07/06/2020 Negative  Negative Final    Commons side effects, risks, benefits, and alternatives for medications and treatment plan prescribed today were discussed, and the patient expressed understanding of the given instructions. Patient is instructed to call or message via MyChart if he/she has any questions or concerns regarding our treatment plan. No barriers to understanding were identified. We discussed Red Flag symptoms and signs in detail. Patient expressed understanding regarding what to do in case of urgent or emergency type symptoms.   Medication list was reconciled, printed and provided to the patient in AVS. Patient instructions and summary information was reviewed with the patient as documented in the AVS. This note was prepared with assistance of Dragon voice recognition software. Occasional wrong-word or sound-a-like substitutions may have occurred due to the inherent limitations of voice recognition software

## 2020-07-07 LAB — URINE CULTURE
MICRO NUMBER:: 11837984
Result:: NO GROWTH
SPECIMEN QUALITY:: ADEQUATE

## 2020-07-07 LAB — CBC WITH DIFFERENTIAL/PLATELET
Basophils Absolute: 0.1 10*3/uL (ref 0.0–0.1)
Basophils Relative: 0.5 % (ref 0.0–3.0)
Eosinophils Absolute: 0.2 10*3/uL (ref 0.0–0.7)
Eosinophils Relative: 2 % (ref 0.0–5.0)
HCT: 39 % (ref 36.0–46.0)
Hemoglobin: 13 g/dL (ref 12.0–15.0)
Lymphocytes Relative: 18.7 % (ref 12.0–46.0)
Lymphs Abs: 2.1 10*3/uL (ref 0.7–4.0)
MCHC: 33.4 g/dL (ref 30.0–36.0)
MCV: 89.9 fl (ref 78.0–100.0)
Monocytes Absolute: 0.8 10*3/uL (ref 0.1–1.0)
Monocytes Relative: 7.3 % (ref 3.0–12.0)
Neutro Abs: 8.2 10*3/uL — ABNORMAL HIGH (ref 1.4–7.7)
Neutrophils Relative %: 71.5 % (ref 43.0–77.0)
Platelets: 209 10*3/uL (ref 150.0–400.0)
RBC: 4.34 Mil/uL (ref 3.87–5.11)
RDW: 12.8 % (ref 11.5–15.5)
WBC: 11.5 10*3/uL — ABNORMAL HIGH (ref 4.0–10.5)

## 2020-07-07 LAB — SEDIMENTATION RATE: Sed Rate: 25 mm/hr — ABNORMAL HIGH (ref 0–20)

## 2020-07-08 ENCOUNTER — Telehealth: Payer: Self-pay

## 2020-07-08 LAB — CERVICOVAGINAL ANCILLARY ONLY
Bacterial Vaginitis (gardnerella): NEGATIVE
Candida Glabrata: NEGATIVE
Candida Vaginitis: NEGATIVE
Chlamydia: POSITIVE — AB
Comment: NEGATIVE
Comment: NEGATIVE
Comment: NEGATIVE
Comment: NEGATIVE
Comment: NEGATIVE
Comment: NORMAL
Neisseria Gonorrhea: NEGATIVE
Trichomonas: NEGATIVE

## 2020-07-08 NOTE — Telephone Encounter (Signed)
Patient calling about lab results. 

## 2020-07-09 NOTE — Telephone Encounter (Signed)
Returned patients call regarding lab results, also saw she viewed results via MyChart. Let her know if she has any questions to give [place] a call.

## 2020-07-10 ENCOUNTER — Other Ambulatory Visit: Payer: Self-pay

## 2020-07-10 ENCOUNTER — Emergency Department (HOSPITAL_BASED_OUTPATIENT_CLINIC_OR_DEPARTMENT_OTHER)
Admission: EM | Admit: 2020-07-10 | Discharge: 2020-07-10 | Disposition: A | Discharge status: Home / Self Care | Payer: BC Managed Care – PPO | Attending: Emergency Medicine | Admitting: Emergency Medicine

## 2020-07-10 ENCOUNTER — Encounter (HOSPITAL_BASED_OUTPATIENT_CLINIC_OR_DEPARTMENT_OTHER): Payer: Self-pay

## 2020-07-10 ENCOUNTER — Emergency Department (HOSPITAL_BASED_OUTPATIENT_CLINIC_OR_DEPARTMENT_OTHER): Payer: BC Managed Care – PPO

## 2020-07-10 ENCOUNTER — Telehealth: Payer: Self-pay

## 2020-07-10 DIAGNOSIS — N739 Female pelvic inflammatory disease, unspecified: Secondary | ICD-10-CM | POA: Diagnosis not present

## 2020-07-10 DIAGNOSIS — N730 Acute parametritis and pelvic cellulitis: Secondary | ICD-10-CM

## 2020-07-10 DIAGNOSIS — N858 Other specified noninflammatory disorders of uterus: Secondary | ICD-10-CM | POA: Diagnosis not present

## 2020-07-10 DIAGNOSIS — N939 Abnormal uterine and vaginal bleeding, unspecified: Secondary | ICD-10-CM | POA: Diagnosis not present

## 2020-07-10 DIAGNOSIS — R1031 Right lower quadrant pain: Secondary | ICD-10-CM | POA: Diagnosis not present

## 2020-07-10 LAB — CBC WITH DIFFERENTIAL/PLATELET
Abs Immature Granulocytes: 0.08 10*3/uL — ABNORMAL HIGH (ref 0.00–0.07)
Basophils Absolute: 0.1 10*3/uL (ref 0.0–0.1)
Basophils Relative: 1 %
Eosinophils Absolute: 0.2 10*3/uL (ref 0.0–0.5)
Eosinophils Relative: 2 %
HCT: 39.4 % (ref 36.0–46.0)
Hemoglobin: 12.7 g/dL (ref 12.0–15.0)
Immature Granulocytes: 1 %
Lymphocytes Relative: 24 %
Lymphs Abs: 2.2 10*3/uL (ref 0.7–4.0)
MCH: 29.5 pg (ref 26.0–34.0)
MCHC: 32.2 g/dL (ref 30.0–36.0)
MCV: 91.6 fL (ref 80.0–100.0)
Monocytes Absolute: 0.7 10*3/uL (ref 0.1–1.0)
Monocytes Relative: 7 %
Neutro Abs: 6 10*3/uL (ref 1.7–7.7)
Neutrophils Relative %: 65 %
Platelets: 220 10*3/uL (ref 150–400)
RBC: 4.3 MIL/uL (ref 3.87–5.11)
RDW: 12 % (ref 11.5–15.5)
WBC: 9.2 10*3/uL (ref 4.0–10.5)
nRBC: 0 % (ref 0.0–0.2)

## 2020-07-10 LAB — URINALYSIS, ROUTINE W REFLEX MICROSCOPIC
Bilirubin Urine: NEGATIVE
Glucose, UA: NEGATIVE mg/dL
Ketones, ur: NEGATIVE mg/dL
Nitrite: NEGATIVE
Protein, ur: NEGATIVE mg/dL
Specific Gravity, Urine: >1.03 — ABNORMAL HIGH (ref 1.005–1.030)
pH: 5.5 (ref 5.0–8.0)

## 2020-07-10 LAB — COMPREHENSIVE METABOLIC PANEL
ALT: 18 U/L (ref 0–44)
AST: 20 U/L (ref 15–41)
Albumin: 4.2 g/dL (ref 3.5–5.0)
Alkaline Phosphatase: 58 U/L (ref 38–126)
Anion gap: 10 (ref 5–15)
BUN: 9 mg/dL (ref 6–20)
CO2: 25 mmol/L (ref 22–32)
Calcium: 9.1 mg/dL (ref 8.9–10.3)
Chloride: 102 mmol/L (ref 98–111)
Creatinine, Ser: 0.63 mg/dL (ref 0.44–1.00)
GFR, Estimated: >60 mL/min (ref >60)
Glucose, Bld: 90 mg/dL (ref 70–99)
Potassium: 3.6 mmol/L (ref 3.5–5.1)
Sodium: 137 mmol/L (ref 135–145)
Total Bilirubin: 0.7 mg/dL (ref 0.3–1.2)
Total Protein: 7.4 g/dL (ref 6.5–8.1)

## 2020-07-10 LAB — WET PREP, GENITAL
Clue Cells Wet Prep HPF POC: NONE SEEN
Sperm: NONE SEEN
Trich, Wet Prep: NONE SEEN
Yeast Wet Prep HPF POC: NONE SEEN

## 2020-07-10 LAB — URINALYSIS, MICROSCOPIC (REFLEX)

## 2020-07-10 LAB — PREGNANCY, URINE: Preg Test, Ur: NEGATIVE

## 2020-07-10 NOTE — ED Notes (Signed)
Patient transported to Ultrasound 

## 2020-07-10 NOTE — Telephone Encounter (Signed)
Patient has called back stating that [facility] is unable to treat her.  Wanted to know what her cost would be if going to the hospital.    I informed patient that I would be unable to give her that info.   I informed patient that once she received a bill for the ED she could reach out to see what options there were with making payment arrangements or completing a financial application.  Informed patient she could go to the ED at [facility].  Patient understood.

## 2020-07-10 NOTE — ED Provider Notes (Signed)
[facility] EMERGENCY DEPARTMENT Provider Note   CSN: [id number] Arrival date & time: 07/10/20  1515     History Chief Complaint  Patient presents with  . Vaginal Bleeding    Tara Merritt is a 25 y.o. female who was diagnosed with chlamydia and possible PID on 07/07/2020 and started on doxycycline and metronidazole who presents today with 3 days of heavy vaginal bleeding, having to change her pad every 1-2 hours having soaked through.  Additionally endorses fatigue at this time.  She endorses pelvic pain is improved with antibiotic therapy (doxycycline and metronidazole) and she is unsure if she continues to have vaginal discharge due to vaginal bleeding. She was not administered rocephin empirically by PCP.   Patient is sexually active with a female partner in a monogamous relationship.  She states prior to this relationship she did have 2 other sexual partners, from whom she believes she contracted this infection.  She states that she developed a pain in her right side yesterday that is worse with deep inhalation and endorses single episode of nausea when the pain was severe, however denies any vomiting, diarrhea, urinary frequency or urgency.  She does endorse mild burning with urination.  Patient is not on any contraception.  She is history of lichen sclerosis, HPV, and G6PD deficiency.   HPI     Past Medical History:  Diagnosis Date  . Atopic dermatitis 04/03/2018  . 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 Lichen sclerosus 04/03/2018  . Migraine     Patient Active Problem List   Diagnosis Date Noted  . 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 Fibrocystic breast changes, bilateral 04/22/2019  . 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 Lichen sclerosus 04/03/2018  . 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 WISDOM TOOTH EXTRACTION  2013     OB History   No obstetric history on file.     Family History  Adopted: Yes  Problem Relation Age of Onset   . Healthy Mother   . Healthy Father     Social History   Tobacco Use  . Smoking status: Never Smoker  . Smokeless tobacco: Never Used  Substance Use Topics  . Alcohol use: Yes  . Drug use: Never    Home Medications Prior to Admission medications   Medication Sig Start Date End Date Taking? Authorizing Provider  doxycycline (VIBRA-TABS) 100 MG tablet Take 1 tablet (100 mg total) by mouth 2 (two) times daily. 07/06/20  Yes [name], MD  metroNIDAZOLE (FLAGYL) 500 MG tablet Take 1 tablet (500 mg total) by mouth 2 (two) times daily for 7 days. 07/06/20 07/13/20 Yes [name], MD  Prenatal Vit-Fe Fumarate-FA (PRENATAL PO) Take by mouth.   Yes [provider]  triamcinolone (NASACORT) 55 MCG/ACT AERO nasal inhaler Place 2 sprays into the nose daily.   Yes [provider]  VITAMIN D PO Take by mouth daily.   Yes [provider]  clobetasol ointment (TEMOVATE) 0.05 % Apply 1 application topically 2 (two) times daily as needed. Patient not taking: No sig reported 04/22/19   [name], MD  Multiple Vitamin (MULTIVITAMIN WITH MINERALS) TABS tablet Take 1 tablet by mouth daily. Patient not taking: No sig reported    [provider]  predniSONE (DELTASONE) 20 MG tablet Take 2 tablets (40 mg total) by mouth daily. Patient not taking: No sig reported 02/03/20   Jarold Motto, PA    Allergies    Patient  has no known allergies.  Review of Systems   Review of Systems  Constitutional: Positive for fatigue. Negative for activity change, appetite change, chills and fever.  HENT: Negative.   Eyes: Negative.   Respiratory: Negative.  Negative for apnea.   Cardiovascular: Negative.   Gastrointestinal: Positive for abdominal pain and nausea. Negative for blood in stool, constipation, diarrhea and vomiting.  Genitourinary: Positive for dysuria, pelvic pain, vaginal bleeding and vaginal discharge. Negative for decreased urine volume, difficulty  urinating, dyspareunia, enuresis, flank pain, frequency, genital sores, hematuria, menstrual problem and urgency.  Musculoskeletal: Negative.  Negative for myalgias.  Skin: Negative.   Neurological: Negative.     Physical Exam Updated Vital Signs BP 108/71 (BP Location: Left Arm)   Pulse 72   Temp 98.5 F (36.9 C) (Oral)   Resp 16   Ht 5' (1.524 m)   Wt 45.4 kg   LMP 06/24/2020 (Exact Date)   SpO2 100%   BMI 19.53 kg/m   Physical Exam Vitals and nursing note reviewed. Exam conducted with a chaperone present.  Constitutional:      Appearance: She is normal weight. She is not ill-appearing or toxic-appearing.  HENT:     Head: Normocephalic and atraumatic.     Nose: Nose normal.     Mouth/Throat:     Mouth: Mucous membranes are moist.     Pharynx: Oropharynx is clear. Uvula midline. No oropharyngeal exudate, posterior oropharyngeal erythema or uvula swelling.     Tonsils: No tonsillar exudate.  Eyes:     General: Lids are normal. Vision grossly intact.        Right eye: No discharge.        Left eye: No discharge.     Extraocular Movements: Extraocular movements intact.     Conjunctiva/sclera: Conjunctivae normal.     Pupils: Pupils are equal, round, and reactive to light.  Neck:     Trachea: Trachea and phonation normal.  Cardiovascular:     Rate and Rhythm: Normal rate and regular rhythm.     Pulses: Normal pulses.     Heart sounds: Normal heart sounds.  Pulmonary:     Effort: Pulmonary effort is normal. No tachypnea, bradypnea, accessory muscle usage, prolonged expiration or respiratory distress.     Breath sounds: Normal breath sounds. No wheezing or rales.  Chest:     Chest wall: No mass, lacerations, deformity, swelling, tenderness, crepitus or edema.  Abdominal:     General: Bowel sounds are normal. There is no distension.     Palpations: Abdomen is soft.     Tenderness: There is abdominal tenderness in the right upper quadrant, right lower quadrant, suprapubic  area and left lower quadrant. There is no right CVA tenderness, left CVA tenderness, guarding or rebound. Negative signs include Murphy's sign, Rovsing's sign and McBurney's sign.  Genitourinary:    General: Normal vulva.     Exam position: Lithotomy position.     Vagina: No foreign body. Bleeding present. No tenderness or lesions.     Cervix: Cervical bleeding present. No cervical motion tenderness or friability.     Uterus: Normal. Not tender.      Adnexa: Right adnexa normal and left adnexa normal.       Right: No tenderness or fullness.         Left: No tenderness or fullness.       Musculoskeletal:        General: No deformity.     Cervical back: Normal range of motion  and neck supple. No crepitus. No pain with movement, spinous process tenderness or muscular tenderness.     Right lower leg: No edema.     Left lower leg: No edema.  Lymphadenopathy:     Cervical: No cervical adenopathy.  Skin:    General: Skin is warm and dry.  Neurological:     Mental Status: She is alert. Mental status is at baseline.     Sensory: Sensation is intact.     Motor: Motor function is intact.     Gait: Gait is intact.  Psychiatric:        Mood and Affect: Mood normal.     ED Results / Procedures / Treatments   Labs (all labs ordered are listed, but only abnormal results are displayed) Labs Reviewed  WET PREP, GENITAL - Abnormal; Notable for the following components:      Result Value   WBC, Wet Prep HPF POC FEW (*)    All other components within normal limits  CBC WITH DIFFERENTIAL/PLATELET - Abnormal; Notable for the following components:   Abs Immature Granulocytes 0.08 (*)    All other components within normal limits  URINALYSIS, ROUTINE W REFLEX MICROSCOPIC - Abnormal; Notable for the following components:   APPearance CLOUDY (*)    Specific Gravity, Urine >1.030 (*)    Hgb urine dipstick SMALL (*)    Leukocytes,Ua TRACE (*)    All other components within normal limits   URINALYSIS, MICROSCOPIC (REFLEX) - Abnormal; Notable for the following components:   Bacteria, UA FEW (*)    All other components within normal limits  COMPREHENSIVE METABOLIC PANEL  PREGNANCY, URINE    EKG None  Radiology US PELVIC COMPLETE W TRANSVAGINAL AND TORSION R/O  Result Date: 07/10/2020 CLINICAL DATA:  Right lower quadrant pain with vaginal bleeding EXAM: TRANSABDOMINAL AND TRANSVAGINAL ULTRASOUND OF PELVIS DOPPLER ULTRASOUND OF OVARIES TECHNIQUE: Both transabdominal and transvaginal ultrasound examinations of the pelvis were performed. Transabdominal technique was performed for global imaging of the pelvis including uterus, ovaries, adnexal regions, and pelvic cul-de-sac. It was necessary to proceed with endovaginal exam following the transabdominal exam to visualize the uterus endometrium ovaries. Color and duplex Doppler ultrasound was utilized to evaluate blood flow to the ovaries. COMPARISON:  None. FINDINGS: Uterus Measurements: 7 x 3.6 x 5.3 cm = volume: 71.1 mL. No fibroids or other mass visualized. Endometrium Thickness: 3 mm. Fluid level within the endometrium with large amount of heterogenous echogenic appearing material in the endometrial canal and more anechoic fluid towards the fundus. Splaying of fluid-filled endometrial stripe. Right ovary Measurements: 3.3 x 2.7 x 2.6 cm = volume: 12.2 mL. Normal appearance/no adnexal mass. Left ovary Measurements: 3.4 x 1.9 x 2.1 cm = volume: 6.7 mL. Normal appearance/no adnexal mass. Pulsed Doppler evaluation of both ovaries demonstrates normal low-resistance arterial and venous waveforms. Other findings Trace free fluid IMPRESSION: 1. Negative for ovarian torsion.  There is trace free fluid. 2. Distension of the endometrial canal by heterogenous echogenic material and fluid level suggestive of blood products. 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 Electronically Signed   By: Jasmine PangKim  Fujinaga M.D.    On: 07/10/2020 17:34    Procedures Procedures   Medications Ordered in ED Medications - No data to display  ED Course  I have reviewed the triage vital signs and the nursing notes.  Pertinent labs & imaging results that were available during my care of the patient were reviewed by me and considered in my medical  decision making (see chart for details).    MDM Rules/Calculators/A&P                          25 year old female who is currently undergoing treatment for chlamydial infection who presents with concern for persistent vaginal bleeding and abdominal pain.  Differential diagnosis includes but is not limited to PID, cystitis, ovarian torsion or ectopic pregnancy, endometriosis, ovarian cyst, TOA, abnormal uterine bleeding, cervicitis.  Mildly hypertensive on intake, vital signs otherwise normal.  Cardiopulmonary exam is normal, abdominal exam with mild discomfort to palpation in the right upper quadrant with deep palpation, as well as tenderness palpation over the suprapubic and right and left pelvis without rebound or guarding.  Will proceed with laboratory studies, UA, pelvic exam, and pelvic ultrasound. GU exam with mild amount of bleeding in the vaginal vault without lesion, cervical motion tenderness, or adnexal fullness/tenderness.  No skin changes in the external genitalia.  CBC unremarkable, CMP unremarkable, Pelvic ultrasound with 3 mm endometrium with a heterogenous echogenic material and fluid level within the uterus suggesting blood products.  There is splaying of the fluid-filled endometrial stripe questionable for mullerian duct anomaly. No adnexal masses bilaterally.   GU exam reassuring as there is no longer pelvic tenderness palpation or adnexal fullness or CMT that was previously noted by PCP at time of pelvic exam earlier this week.  UA unremarkable.  No further work-up warranted in the S time.  Suspect patient's abnormal uterine bleeding is secondary to PID.   I personally reviewed the patient's cultures performed by her primary care doctor, culture grew chlamydia only without gonorrhea.  Will not empirically administer Rocephin in the ED at this time.  Recommend to complete her course of doxycycline and metronidazole as prescribed by her PCP and follow-up with them next week for reevaluation of her vaginal bleeding.  Atlas voiced understanding of her medical evaluation and treatment plan.  Each of her questions was answered to her expressed satisfaction.  Return precautions were given.  Patient is well-appearing, stable, and appropriate for discharge at this time.  This chart was dictated using voice recognition software, Dragon. Despite the best efforts of this provider to proofread and correct errors, errors may still occur which can change documentation meaning.  Final Clinical Impression(s) / ED Diagnoses Final diagnoses:  PID (acute pelvic inflammatory disease)    Rx / DC Orders ED Discharge Orders    None       Sherrilee Gilles 07/10/20 1913    Little, Ambrose Finland, MD 07/13/20 [phone]

## 2020-07-10 NOTE — Telephone Encounter (Signed)
FYI  Did call and speak to patient, she stated that she is being seen at urgent care today.

## 2020-07-10 NOTE — ED Triage Notes (Signed)
Pt states that she was diagnosed with PID and Chlamydia this past Monday and started having heavy vaginal bleeding starting Tuesday.

## 2020-07-10 NOTE — Telephone Encounter (Signed)
Nurse Assessment Nurse: Lily Kocher, RN, Adriana Date/Time (Eastern Time): 07/10/2020 12:38:10 PM Confirm and document reason for call. If symptomatic, describe symptoms. ---pt states she was recently diagnosed with chlamydia (mon). is now bleeding since tuesday. states it is worse than her period. pelvic tenderness and sharp right sided lower quad pain. 4/10 Does the patient have any new or worsening symptoms? ---Yes Will a triage be completed? ---Yes Related visit to physician within the last 2 weeks? ---Yes Does the PT have any chronic conditions? (i.e. diabetes, asthma, this includes High risk factors for pregnancy, etc.) ---Yes List chronic conditions. ---sclerosis Is the patient pregnant or possibly pregnant? (Ask all females between the ages of 71-55) ---No Is this a behavioral health or substance abuse call? ---No Guidelines Guideline Title Affirmed Question Affirmed Notes Nurse Date/Time Lamount Cohen Time) Vaginal Bleeding - Abnormal SEVERE vaginal bleeding (e.g., soaking 2 pads or Lily Kocher, RN, Adriana 07/10/2020 12:42:33 PM PLEASE NOTE: All timestamps contained within this report are represented as [place] Standard Time. CONFIDENTIALTY NOTICE: This fax transmission is intended only for the addressee. It contains information that is legally privileged, confidential or otherwise protected from use or disclosure. If you are not the intended recipient, you are strictly prohibited from reviewing, disclosing, copying using or disseminating any of this information or taking any action in reliance on or regarding this information. If you have received this fax in error, please notify us immediately by telephone so that we can arrange for its return to [place]. Phone: [phone], Toll-Free: [phone], Fax: [phone] Page: 2 of 2 Call Id: [id number] Guidelines Guideline Title Affirmed Question Affirmed Notes Nurse Date/Time Lamount Cohen Time) tampons per hour and present 2 or more hours; 1  menstrual cup every 2 hours) Disp. Time Lamount Cohen Time) Disposition Final User 07/10/2020 12:46:15 PM Go to ED Now (or PCP triage) Yes Lily Kocher, RN, Adriana Caller Disagree/Comply Comply Caller Understands Yes PreDisposition InappropriateToAsk Care Advice Given Per Guideline GO TO ED NOW (OR PCP TRIAGE): ANOTHER ADULT SHOULD DRIVE: * It is better and safer if another adult drives instead of you. CARE ADVICE given per Vaginal Bleeding, Abnormal (Adult) guideline. Referrals GO TO FACILITY UNDECIDE

## 2020-07-10 NOTE — Telephone Encounter (Signed)
Patient returned call again. °

## 2020-07-10 NOTE — Discharge Instructions (Addendum)
You were seen in the ER today for evaluation of your vaginal bleeding in context of your recently diagnosed pelvic inflammatory disease.  Your physical exam, vital signs, blood work, and ultrasound are very reassuring.  Pelvic infections from sexually transmitted infection such as chlamydia, with which you have been diagnosed can cause abnormal uterine bleeding.  Please continue to monitor the quantity of bleeding you are experiencing and follow-up with your primary care doctor next week.  Return to the ER if you develop any new new abdominal pain, nausea or vomiting that does not stop, shortness of breath, or any other severe symptoms. Complete your antibiotic therapy as previously prescribed.

## 2020-07-10 NOTE — Telephone Encounter (Signed)
FYI

## 2020-07-10 NOTE — Telephone Encounter (Signed)
Spoke with patient regarding labs and recent triage note

## 2020-07-20 ENCOUNTER — Other Ambulatory Visit: Payer: Self-pay

## 2020-07-20 ENCOUNTER — Encounter: Payer: Self-pay | Admitting: Family Medicine

## 2020-07-20 ENCOUNTER — Ambulatory Visit (INDEPENDENT_AMBULATORY_CARE_PROVIDER_SITE_OTHER): Payer: BC Managed Care – PPO | Admitting: Family Medicine

## 2020-07-20 VITALS — BP 120/78 | HR 71 | Temp 98.2°F | Ht 60.0 in | Wt 104.6 lb

## 2020-07-20 DIAGNOSIS — B009 Herpesviral infection, unspecified: Secondary | ICD-10-CM | POA: Diagnosis not present

## 2020-07-20 DIAGNOSIS — N730 Acute parametritis and pelvic cellulitis: Secondary | ICD-10-CM

## 2020-07-20 DIAGNOSIS — R9389 Abnormal findings on diagnostic imaging of other specified body structures: Secondary | ICD-10-CM | POA: Diagnosis not present

## 2020-07-20 DIAGNOSIS — Z8742 Personal history of other diseases of the female genital tract: Secondary | ICD-10-CM | POA: Insufficient documentation

## 2020-07-20 MED ORDER — VALACYCLOVIR HCL 1 G PO TABS: 1000.0000 mg | ORAL_TABLET | Freq: Two times a day (BID) | ORAL | 0 refills | Status: AC

## 2020-07-20 NOTE — Patient Instructions (Addendum)
Please return in 4-6 weeks for recheck.   We will schedule a follow up ultrasound in 3-6 months OR we can ask Dr. Renaldo Fiddler to assess.   I will release your lab results to you on your MyChart account with further instructions. Please reply with any questions.   If you have any questions or concerns, please don't hesitate to send me a message via MyChart or call the office at [phone]. Thank you for visiting with [place] today! It's our pleasure caring for you.

## 2020-07-20 NOTE — Progress Notes (Signed)
Subjective  CC:  Chief Complaint  Patient presents with  . Pelvic Pain    Says pain has stopped, no more vag discomfort either  . Armpit    Right arm pit, looks like cluster of blisters with pus. Spreading to back, and chest area.    HPI: Tara Merritt is a 25 y.o. female who presents to the office today to address the problems listed above in the chief complaint.  PID: See last note.  Patient had significant pelvic pain, cervical motion tenderness, elevated white blood cell count and sed rate.  Her pelvic cultures returned positive for chlamydia.  She was treated with doxycycline.  She also had some heavy menstrual bleeding.  She was evaluated in the emergency room.  I reviewed those notes in detail.  Her white blood cell count had improved.  CMP was unremarkable.  Urine was unremarkable.  She had abdominal pain but cervical motion tenderness was improved.  Pelvic ultrasound done at that time showed blood and fluid in the vault with splaying of the endometrial stripe concerning for possible mullerian defect.  No ovarian torsion or masses were seen.  She was discharged without changes in her medications.  She reports that she is done better.  She no longer is having pelvic pain or bleeding.  No fevers or chills.  Her partner was tested recently and is now on antibiotics.  HIV RPR screening were not done.  3 days ago she had soreness in her right axilla and shoulder.  The next day she broke out with a blistering rash.  She has no fevers chills or systemic symptoms.  She does get cold sores.  No trauma.  No radicular symptoms.  No redness.  No weakness in the upper extremity.  She is to play tennis match tonight and wants to be sure this will not worsen the problem.  This is the first rash of this type on the skin.  She is not currently on birth control.  She had been on OCPs in the past   Assessment  1. PID (acute pelvic inflammatory disease)   2. Herpes simplex   3. Abnormal pelvic  ultrasound   4. History of PID      Plan   PID: Clinically resolved.  Blood count had improved.  She is completed treatment.  Safe sex counseling discussed.  Patient to use condoms until both she and her partner have had negative STD screens.  She will return in 4 to 6 weeks for repeat testing.  Screen for HIV and syphilis given positive chlamydia.  Abnormal pelvic ultrasound discussed.  Recommend repeating ultrasound in 3 to 6 months to assess endometrial stripe and possible mullerian defect.  Will defer for now given recovering from PID.  Recommend birth control.  Can discuss more in 4 to 6 weeks at her follow-up.  She will use condoms until then.  Follow up: 4 to 6 weeks to recheck STD screen. Visit date not found  Orders Placed This Encounter  Procedures  . HIV Antibody (routine testing w rflx)  . RPR   Meds ordered this encounter  Medications  . valACYclovir (VALTREX) 1000 MG tablet    Sig: Take 1 tablet (1,000 mg total) by mouth 2 (two) times daily for 7 days.    Dispense:  14 tablet    Refill:  0      I reviewed the patients updated PMH, FH, and SocHx.    Patient Active Problem List   Diagnosis Date Noted  .  Abnormal pelvic ultrasound 07/20/2020  . History of PID 07/20/2020  . Herpes simplex 07/20/2020  . 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 Fibrocystic breast changes, bilateral 04/22/2019  . 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 Lichen sclerosus 04/03/2018  . 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 Prenatal Vit-Fe Fumarate-FA (PRENATAL PO) Take by mouth.  . valACYclovir (VALTREX) 1000 MG tablet Take 1 tablet (1,000 mg total) by mouth 2 (two) times daily for 7 days.  Marland Kitchen VITAMIN D PO Take by mouth daily.    Allergies: Patient has No Known Allergies. Family History: Patient family history includes Healthy in her father and mother. She was adopted. Social History:  Patient  reports that she has never smoked. She has never used smokeless tobacco. She  reports current alcohol use. She reports that she does not use drugs.  Review of Systems: Constitutional: Negative for fever malaise or anorexia Cardiovascular: negative for chest pain Respiratory: negative for SOB or persistent cough Gastrointestinal: negative for abdominal pain  Objective  Vitals: BP 120/78   Pulse 71   Temp 98.2 F (36.8 C) (Temporal)   Ht 5' (1.524 m)   Wt 104 lb 9.6 oz (47.4 kg)   LMP 06/24/2020 (Exact Date)   SpO2 98%   BMI 20.43 kg/m  General: no acute distress , A&Ox3 HEENT: PEERL, conjunctiva normal, neck is supple Cardiovascular:  RRR without murmur or gallop.  Respiratory:  Good breath sounds bilaterally, CTAB with normal respiratory effort Gastrointestinal: soft, flat abdomen, normal active bowel sounds, no palpable masses, no hepatosplenomegaly, no appreciated hernias No suprapubic or lower pelvic tenderness. Skin:  Warm, right central axilla with 2 to 3 cm annular erythematous lesion with multiple blisters present.  No streaking.     Commons side effects, risks, benefits, and alternatives for medications and treatment plan prescribed today were discussed, and the patient expressed understanding of the given instructions. Patient is instructed to call or message via MyChart if he/she has any questions or concerns regarding our treatment plan. No barriers to understanding were identified. We discussed Red Flag symptoms and signs in detail. Patient expressed understanding regarding what to do in case of urgent or emergency type symptoms.   Medication list was reconciled, printed and provided to the patient in AVS. Patient instructions and summary information was reviewed with the patient as documented in the AVS. This note was prepared with assistance of Dragon voice recognition software. Occasional wrong-word or sound-a-like substitutions may have occurred due to the inherent limitations of voice recognition software  This visit occurred during the  SARS-CoV-2 public health emergency.  Safety protocols were in place, including screening questions prior to the visit, additional usage of staff PPE, and extensive cleaning of exam room while observing appropriate contact time as indicated for disinfecting solutions.

## 2020-07-21 LAB — RPR: RPR Ser Ql: NONREACTIVE

## 2020-07-21 LAB — HIV ANTIBODY (ROUTINE TESTING W REFLEX): HIV 1&2 Ab, 4th Generation: NONREACTIVE

## 2020-08-31 ENCOUNTER — Other Ambulatory Visit (HOSPITAL_COMMUNITY)
Admission: RE | Admit: 2020-08-31 | Discharge: 2020-08-31 | Disposition: A | Discharge status: Home / Self Care | Payer: BC Managed Care – PPO | Source: Ambulatory Visit | Attending: Family Medicine | Admitting: Family Medicine

## 2020-08-31 ENCOUNTER — Ambulatory Visit (INDEPENDENT_AMBULATORY_CARE_PROVIDER_SITE_OTHER): Payer: BC Managed Care – PPO | Admitting: Family Medicine

## 2020-08-31 ENCOUNTER — Other Ambulatory Visit: Payer: Self-pay

## 2020-08-31 ENCOUNTER — Encounter: Payer: Self-pay | Admitting: Family Medicine

## 2020-08-31 VITALS — BP 118/78 | HR 57 | Temp 98.4°F | Resp 16 | Wt 103.4 lb

## 2020-08-31 DIAGNOSIS — Z8742 Personal history of other diseases of the female genital tract: Secondary | ICD-10-CM | POA: Diagnosis not present

## 2020-08-31 DIAGNOSIS — Z8619 Personal history of other infectious and parasitic diseases: Secondary | ICD-10-CM | POA: Insufficient documentation

## 2020-08-31 DIAGNOSIS — R9389 Abnormal findings on diagnostic imaging of other specified body structures: Secondary | ICD-10-CM

## 2020-08-31 DIAGNOSIS — Z3009 Encounter for other general counseling and advice on contraception: Secondary | ICD-10-CM

## 2020-08-31 NOTE — Progress Notes (Signed)
Subjective  CC:  Chief Complaint  Patient presents with   Pelvic Inflammatory Disease    Retest for chlamydia, partner did get tested and results negative     HPI: Tara Merritt is a 25 y.o. female who presents to the office today to address the problems listed above in the chief complaint. F/u PID: clincially resolved. No further pain, discharge, abnl vaginal bleeding. Needs TOC; partner has been tested and is negative. Uses condoms.  Abnl pelvic ultrasound: reviewed [place] during infection: splaying of endometrium: possible due to infection and bleeding vs mullerian defect.  Contraception: pt doesn not want to become pregnant now or in the near future. Had used OCPs for an extended number of years and nexplanon; stopped due to concerns of risks of long term hormonal contraceptive use. Did not have side effects. OCPs did help acne.   Assessment  1. History of PID   2. History of chlamydia   3. Abnormal pelvic ultrasound   4. Encounter for counseling regarding contraception      Plan  PID and chlamydia:  TOC today. Std prevention discussed.  Contraceptive counseling. Discussed efficacy rate of condoms only and options for more efficacious birth control.  Viable options include ocps, patch, vaginal ring,  or nexplanon. Avoid IUD given recent STD.  Recheck pevlic ultrasound in 4-6 months to better clarify the possible abnormality.   Follow up: feb for cpe  Visit date not found  Orders Placed This Encounter  Procedures   US PELVIS (TRANSABDOMINAL ONLY)   No orders of the defined types were placed in this encounter.     I reviewed the patients updated PMH, FH, and SocHx.    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   VITAMIN D PO Take by mouth daily.    Allergies: Patient has No Known Allergies. Family History: Patient family history includes Healthy in her father and mother. She was adopted. Social History:  Patient  reports that she has never smoked. She has never used smokeless tobacco. She reports current alcohol use. She reports that she does not use drugs.  Review of Systems: Constitutional: Negative for fever malaise or anorexia Cardiovascular: negative for chest pain Respiratory: negative for SOB or persistent cough Gastrointestinal: negative for abdominal pain  Objective  Vitals: BP 118/78   Pulse (!) 57   Temp 98.4 F (36.9 C) (Temporal)   Resp 16   Wt 103 lb 6.4 oz (46.9 kg)   SpO2 100%   BMI 20.19 kg/m  General: no acute distress , A&Ox3   Commons side effects, risks, benefits, and alternatives for medications and treatment plan prescribed today were discussed, and the patient expressed understanding of the given instructions. Patient is instructed to call or message via MyChart if he/she has any questions or concerns regarding our treatment plan. No barriers to understanding were identified. We discussed Red Flag symptoms and signs in detail. Patient expressed understanding regarding what to do in case of urgent or emergency type symptoms.  Medication list was reconciled, printed and provided to the patient in AVS. Patient instructions and summary information was reviewed with the  patient as documented in the AVS. This note was prepared with assistance of Dragon voice recognition software. Occasional wrong-word or sound-a-like substitutions may have occurred due to the inherent limitations of voice recognition software  This visit occurred during the SARS-CoV-2 public health emergency.  Safety protocols were in place, including screening questions prior to the visit,  additional usage of staff PPE, and extensive cleaning of exam room while observing appropriate contact time as indicated for disinfecting solutions.

## 2020-08-31 NOTE — Patient Instructions (Signed)
Please follow up if symptoms do not improve or as needed.    Consider contraception: Oral contraceptives (the birth control pill), the patch, the vaginal ring or Nexplanon would all be good choices.

## 2020-09-01 LAB — CERVICOVAGINAL ANCILLARY ONLY
Chlamydia: NEGATIVE
Comment: NEGATIVE
Comment: NORMAL
Neisseria Gonorrhea: NEGATIVE

## 2020-09-04 ENCOUNTER — Telehealth: Payer: Self-pay

## 2020-09-04 NOTE — Telephone Encounter (Signed)
Spoke to pt told her she would need to contact her insurance to see if they will cover Nexplanon. Pt said she already did and they will cover 100%. Told pt okay I will be back in the office on Tuesday and will order one and let you know when it comes in so we can schedule you for an appt. Pt verbalized understanding and asked how long it takes for one to come in? Told pt just a day or two once it comes in. Pt verbalized understanding.

## 2020-09-04 NOTE — Telephone Encounter (Signed)
Patient requested to get the nexplanon birth control implant. spoke with dr.andy and she states we need to get approved with insurance and then order nexplanon to have it in office and when that is complete can we call and schedule appt

## 2020-09-04 NOTE — Telephone Encounter (Signed)
[facility] is calling in regards to the patients [place], they need confirmation that Elmer isnt needing a transvaginal US.

## 2020-09-08 NOTE — Telephone Encounter (Signed)
Patient is scheduled   

## 2020-09-08 NOTE — Telephone Encounter (Signed)
Please call pt and schedule Nexplanon Implant with Dr. Mardelle Matte. I have the Nexplanon in office. Thanks

## 2020-09-16 ENCOUNTER — Telehealth: Payer: Self-pay

## 2020-09-16 ENCOUNTER — Other Ambulatory Visit: Payer: Self-pay

## 2020-09-16 DIAGNOSIS — R9389 Abnormal findings on diagnostic imaging of other specified body structures: Secondary | ICD-10-CM

## 2020-09-16 NOTE — Telephone Encounter (Signed)
[facility] is calling regarding patients [place] order please return call when able [phone]

## 2020-09-16 NOTE — Telephone Encounter (Signed)
LMOVM to return call.

## 2020-09-18 ENCOUNTER — Other Ambulatory Visit: Payer: BC Managed Care – PPO

## 2020-09-18 ENCOUNTER — Ambulatory Visit
Admission: RE | Admit: 2020-09-18 | Discharge: 2020-09-18 | Disposition: A | Discharge status: Home / Self Care | Payer: BC Managed Care – PPO | Source: Ambulatory Visit | Attending: Family Medicine | Admitting: Family Medicine

## 2020-09-18 DIAGNOSIS — R9389 Abnormal findings on diagnostic imaging of other specified body structures: Secondary | ICD-10-CM

## 2020-09-18 DIAGNOSIS — Z0389 Encounter for observation for other suspected diseases and conditions ruled out: Secondary | ICD-10-CM | POA: Diagnosis not present

## 2020-09-21 NOTE — Progress Notes (Signed)
Please call patient: I have reviewed his/her ultrasound results. Ultrasound does confirm a possible uterine abnormality. I recommend a referral to GYN to further discuss and evaluate. It is not urgent. Please refer. thanks

## 2020-09-28 ENCOUNTER — Ambulatory Visit (INDEPENDENT_AMBULATORY_CARE_PROVIDER_SITE_OTHER): Payer: BC Managed Care – PPO | Admitting: Family Medicine

## 2020-09-28 ENCOUNTER — Encounter: Payer: Self-pay | Admitting: Family Medicine

## 2020-09-28 ENCOUNTER — Other Ambulatory Visit: Payer: Self-pay

## 2020-09-28 VITALS — BP 112/80 | HR 56 | Temp 98.2°F | Resp 16 | Wt 103.4 lb

## 2020-09-28 DIAGNOSIS — Z30017 Encounter for initial prescription of implantable subdermal contraceptive: Secondary | ICD-10-CM

## 2020-09-28 DIAGNOSIS — R9389 Abnormal findings on diagnostic imaging of other specified body structures: Secondary | ICD-10-CM

## 2020-09-28 LAB — POCT URINE PREGNANCY: Preg Test, Ur: NEGATIVE

## 2020-09-28 MED ORDER — ETONOGESTREL 68 MG ~~LOC~~ IMPL
68.0000 mg | DRUG_IMPLANT | Freq: Once | SUBCUTANEOUS | Status: AC
  Administered 2020-09-28: 68 mg via SUBCUTANEOUS

## 2020-09-28 NOTE — Addendum Note (Signed)
Addended by: Joylene Igo L on: 09/28/2020 01:55 PM   Modules accepted: Orders

## 2020-09-28 NOTE — Patient Instructions (Addendum)
Please return in February of next year for your annual physical.  Sooner if any problems with the nexplanon.   Your nexplanon is good for 3 years and will be due out in July of 2025.  Common side effects are irregular bleeding. Monitor for mood changes.   I have placed a referral to [facility]. We will call you to get you scheduled.   If you have any questions or concerns, please don't hesitate to send me a message via MyChart or call the office at [phone]. Thank you for visiting with [place] today! It's our pleasure caring for you.   Nexplanon Instructions After Insertion  Keep bandage clean and dry for 24 hours  May use ice/Tylenol/Ibuprofen for soreness or pain  If you develop fever, drainage or increased warmth from incision site-contact office immediately

## 2020-09-28 NOTE — Progress Notes (Signed)
Patent presents to the office to receive her Nexplanon implant. Provider placed  the 68mg  implant into patient left arm. Patient tolerated well. Implant will expire on 09/29/2023. Patient notified and voiced understanding.

## 2020-09-28 NOTE — Progress Notes (Signed)
Subjective  CC:  Chief Complaint  Patient presents with   nexaplon implant    HPI: Tara Merritt is a 25 y.o. female who presents to the office today to address the problems listed above in the chief complaint. Contraception: Discussed Nexplanon, possible side effects, expectations and length of use.  Patient reports she did have a Nexplanon in 2016.  She used it for 2 years.  Relationship ended and she no longer needing birth control.  She said it did help her acne and she does not remember irregular bleeding.  She currently is on her menstrual cycle. 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  Discussed persistent abnormality and need for further evaluation with gynecology.  [facility]  She would be happy to go back there.  She saw Dr. Vickey Sages.  Assessment  1. Nexplanon insertion   2. Abnormal pelvic ultrasound      Plan  Nexplanon insertion: Routine insertion without complications.  Routine post procedure instructions given. Abnormal pelvic ultrasound: Refer to GYN for further evaluation for possible mullerian defect.  Follow up: Return in about 7 months (around 05/01/2021) for complete physical.  Visit date not found  Orders Placed This Encounter  Procedures   Ambulatory referral to Obstetrics / Gynecology   POCT urine pregnancy   No orders of the defined types were placed in this encounter.     I reviewed the patients updated PMH, FH, and SocHx.    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     Allergies: Patient has No Known Allergies. Family History: Patient family history includes Healthy in her father and mother. She was adopted. Social History:  Patient  reports that she has never smoked. She has never used smokeless tobacco. She reports current alcohol use. She reports that she does not use drugs.  Review of Systems: Constitutional: Negative for fever malaise or anorexia Cardiovascular: negative for chest pain Respiratory: negative for SOB or persistent cough Gastrointestinal: negative for abdominal pain  Objective  Vitals: BP 112/80   Pulse (!) 56   Temp 98.2 F (36.8 C) (Temporal)   Resp 16   Wt 103 lb 6.4 oz (46.9 kg)   SpO2 98%   BMI 20.19 kg/m  General: no acute distress , A&Ox3 Cardiovascular:  RRR without murmur or gallop.  Respiratory:  Good breath sounds bilaterally, CTAB with normal respiratory effort Skin:  Warm, no rashes  Nexplanon Insertion Procedure Note  PRE-OP DIAGNOSIS: Patient desires long-term, reversible contraception. POST-OP DIAGNOSIS: Same PROCEDURE: Nexplanon placement  PROCEDURE: -Written and verbal informed consent obtained, risks discussed included: bleeding, irregular menses, infection, pain/discomfort, cost for removal. -The appropriate universal timeout was taken and the patient's non-dominant hand was identified. -Patient was instructed to lie supine on the examination table with her non-dominant arm flexed at the elbow and externally rotated so that her hand was underneath her head (or as close as possible). -The insertion site was identified on the inner side of the non-dominant upper arm, 8 -10 cm from medial epicondyle of the humerus and 3-5 cm posterior to  the sulcus (groove) between the biceps and triceps muscles. The insertion site was marked with a sterile marker. -A second spot (guiding mark) was made with the sterile marker 5 cm proximal (toward the shoulder) to the mark of the insertion site to serve as a  direction guide during the Nexplanon insertion. -The insertion site was confirmed as the correct location on the inner side of the arm. -The skin was cleaned from the insertion site to the guiding mark with an antiseptic solution (Betadine / Chloraprep) and draped in a sterile fashion. -Anesthesia was achieved by injecting 2 mL of 1% lidocaine (without epinephrine) just under the skin along the planned insertion tunnel. Anesthesia confirmed. -The skin was punctured with the tip of the Nexplanon trocar needle slightly angled less than 30. The needle was inserted until the bevel (slanted opening of the tip) was just under the skin (and no further). -The applicator was then lowered to a horizontal position. While lifting the skin with the tip of the needle, the needle was inserted to its full length. Mild resistance was felt but no excessive force was used. The needle slid in easily. -The applicator was kept in the same position with the needle inserted to its full length. The free hand was used to keep the applicator in the same position. Then the purple slider was unlocked by pushing it slightly down. The purple slider was then moved fully back until it stopped, delivering the Nexplanon capsule subcutaneously. The trocar was removed from the insertion site. -Nexplanon capsule was palpated by provider and patient to assure satisfactory placement. -A Bandage and a pressure pressing were applied to the area. Patient to keep the pressure dressing for 24 hrs and the bandage for 3-5 days. -Anticipatory guidance, as well as standard post-procedure care, was explained. -Return precautions are given. The patient tolerated the procedure well without complications. -Estimated blood loss was less than 0.5 mL Follow-up: 4-6 weeks, at which time the placement will be checked.   Commons side effects, risks, benefits, and alternatives for medications and treatment plan prescribed today were discussed, and the  patient expressed understanding of the given instructions. Patient is instructed to call or message via MyChart if he/she has any questions or concerns regarding our treatment plan. No barriers to understanding were identified. We discussed Red Flag symptoms and signs in detail. Patient expressed understanding regarding what to do in case of urgent or emergency type symptoms.  Medication list was reconciled, printed and provided to the patient in AVS. Patient instructions and summary information was reviewed with the patient as documented in the AVS. This note was prepared with assistance of Dragon voice recognition software. Occasional wrong-word or sound-a-like substitutions may have occurred due to the inherent limitations of voice recognition software  This visit occurred during the SARS-CoV-2 public health emergency.  Safety protocols were in place, including screening questions prior to the visit, additional usage of staff PPE, and extensive cleaning of exam room while observing appropriate contact time as indicated for disinfecting solutions.

## 2020-10-01 ENCOUNTER — Ambulatory Visit: Payer: BC Managed Care – PPO | Admitting: Family Medicine

## 2020-10-01 ENCOUNTER — Telehealth: Payer: Self-pay

## 2020-10-01 NOTE — Telephone Encounter (Signed)
Tara Merritt, can you look into this. Pt was referred due to abnormal U/S to evaluate and treat. MRI would be at there discretion if they want to order it.

## 2020-10-01 NOTE — Telephone Encounter (Signed)
Received a call from Tara Merritt that she was referred to [facility] for an MRI and they dont do MRI, and was advised to go to [facility]. Wanting to know the next steps.

## 2020-10-08 DIAGNOSIS — N926 Irregular menstruation, unspecified: Secondary | ICD-10-CM | POA: Diagnosis not present

## 2020-10-13 ENCOUNTER — Other Ambulatory Visit: Payer: Self-pay | Admitting: Obstetrics and Gynecology

## 2020-10-13 DIAGNOSIS — Q519 Congenital malformation of uterus and cervix, unspecified: Secondary | ICD-10-CM

## 2020-10-25 ENCOUNTER — Other Ambulatory Visit: Payer: Self-pay | Admitting: Obstetrics and Gynecology

## 2020-10-25 ENCOUNTER — Ambulatory Visit
Admission: RE | Admit: 2020-10-25 | Discharge: 2020-10-25 | Disposition: A | Discharge status: Home / Self Care | Payer: BC Managed Care – PPO | Source: Ambulatory Visit | Attending: Obstetrics and Gynecology | Admitting: Obstetrics and Gynecology

## 2020-10-25 ENCOUNTER — Other Ambulatory Visit: Payer: Self-pay

## 2020-10-25 DIAGNOSIS — N281 Cyst of kidney, acquired: Secondary | ICD-10-CM | POA: Diagnosis not present

## 2020-10-25 DIAGNOSIS — Q519 Congenital malformation of uterus and cervix, unspecified: Secondary | ICD-10-CM

## 2020-10-25 MED ORDER — GADOBENATE DIMEGLUMINE 529 MG/ML IV SOLN
9.0000 mL | Freq: Once | INTRAVENOUS | Status: AC | PRN
  Administered 2020-10-25: 9 mL via INTRAVENOUS

## 2020-10-26 ENCOUNTER — Other Ambulatory Visit: Payer: Self-pay | Admitting: Obstetrics and Gynecology

## 2020-10-26 ENCOUNTER — Ambulatory Visit: Payer: BC Managed Care – PPO | Admitting: Family Medicine

## 2020-10-26 DIAGNOSIS — Q519 Congenital malformation of uterus and cervix, unspecified: Secondary | ICD-10-CM

## 2020-10-27 ENCOUNTER — Other Ambulatory Visit: Payer: Self-pay

## 2020-10-27 ENCOUNTER — Ambulatory Visit
Admission: RE | Admit: 2020-10-27 | Discharge: 2020-10-27 | Disposition: A | Discharge status: Home / Self Care | Payer: BC Managed Care – PPO | Source: Ambulatory Visit | Attending: Obstetrics and Gynecology | Admitting: Obstetrics and Gynecology

## 2020-10-27 DIAGNOSIS — Q519 Congenital malformation of uterus and cervix, unspecified: Secondary | ICD-10-CM

## 2021-03-23 ENCOUNTER — Encounter: Payer: Self-pay | Admitting: Family

## 2021-03-23 ENCOUNTER — Other Ambulatory Visit (HOSPITAL_COMMUNITY)
Admission: RE | Admit: 2021-03-23 | Discharge: 2021-03-23 | Disposition: A | Discharge status: Home / Self Care | Payer: BC Managed Care – PPO | Source: Ambulatory Visit | Attending: Family | Admitting: Family

## 2021-03-23 ENCOUNTER — Other Ambulatory Visit: Payer: Self-pay

## 2021-03-23 ENCOUNTER — Ambulatory Visit (INDEPENDENT_AMBULATORY_CARE_PROVIDER_SITE_OTHER): Payer: BC Managed Care – PPO | Admitting: Family

## 2021-03-23 VITALS — BP 113/76 | HR 59 | Temp 98.4°F | Ht 60.0 in | Wt 107.4 lb

## 2021-03-23 DIAGNOSIS — R102 Pelvic and perineal pain: Secondary | ICD-10-CM | POA: Insufficient documentation

## 2021-03-23 LAB — POCT URINALYSIS DIPSTICK
Bilirubin, UA: NEGATIVE
Blood, UA: NEGATIVE
Glucose, UA: NEGATIVE
Ketones, UA: NEGATIVE
Leukocytes, UA: NEGATIVE
Nitrite, UA: NEGATIVE
Protein, UA: NEGATIVE
Spec Grav, UA: 1.01 (ref 1.010–1.025)
Urobilinogen, UA: 0.2 E.U./dL
pH, UA: 6 (ref 5.0–8.0)

## 2021-03-23 NOTE — Assessment & Plan Note (Addendum)
denies dysuria & other UTI sx, no nausea, LMP 03/06/2021. pt has Nexplanon, usually regular cycles with regular bleeding. denies constipation, diarrhea, reports some bloating & flatulence. plays tennis, denies injury, no abdominal crunches or other workout, but does run on treadmill some. UA neg. Will send for STI testing, pt + for chlamydia last year, reports sexually active with steady BF. Also recommended trying OTC Simethicone xtra strength as directed on package, avoid gas producing foods, straws, gum, etc.

## 2021-03-23 NOTE — Patient Instructions (Addendum)
It was very nice to see you today!  As discussed, we are running your urine to check for any STIs, results should be found on MyChart in 2-3 days and I will send a message through MyChart. For your bloating and gas try over the counter extra strength Gas-X (Simethicone is generic) per package directions for the next few days and see if this eases your symptoms. The medicine will push any air out of your stomach (belching) or colon (rectum "pooting"), so don't hold it in!    PLEASE NOTE:  If you had any lab tests please let us know if you have not heard back within a few days. You may see your results on MyChart before we have a chance to review them but we will give you a call once they are reviewed by [place]. If we ordered any referrals today, please let us know if you have not heard from their office within the next week.   Please try these tips to maintain a healthy lifestyle:  Eat most of your calories during the day when you are active. Eliminate processed foods including packaged sweets (pies, cakes, cookies), reduce intake of potatoes, white bread, white pasta, and white rice. Look for whole grain options, oat flour or almond flour.  Each meal should contain half fruits/vegetables, one quarter protein, and one quarter carbs (no bigger than a computer mouse).  Cut down on sweet beverages. This includes juice, soda, and sweet tea. Also watch fruit intake, though this is a healthier sweet option, it still contains natural sugar! Limit to 3 servings daily.  Drink at least 1 glass of water with each meal and aim for at least 8 glasses per day  Exercise at least 150 minutes every week.

## 2021-03-23 NOTE — Progress Notes (Signed)
Subjective:     Patient ID: Tara Merritt, female    DOB: [birth year], 26 y.o.   MRN: [id number]  Chief Complaint  Patient presents with   Abdominal Pain    Started over the weekend at the gym. She has not taken anything for pain. She has used a heating pad. She denies nausea, vomiting and diarrhea.      HPI: Pain She reports new onset lower abdominal pain. There was not an injury that may have caused the pain. The pain started a few days ago and is staying constant. The pain does not radiate. The pain is described as aching and soreness, is moderate in intensity, occurring intermittently. Symptoms are worse in the: anytime of day, random.  Aggravating factors: walking She has tried application of heat with little relief.    There are no preventive care reminders to display for this patient.  Past Medical History:  Diagnosis Date   Atopic dermatitis 04/03/2018   G6PD deficiency 04/03/2018   Lichen sclerosus 04/03/2018   Migraine     Past Surgical History:  Procedure Laterality Date   WISDOM TOOTH EXTRACTION  2013    Outpatient Medications Prior to Visit  Medication Sig Dispense Refill   Prenatal Vit-Fe Fumarate-FA (PRENATAL PO) Take by mouth. (Patient not taking: Reported on 09/28/2020)     VITAMIN D PO Take by mouth daily. (Patient not taking: Reported on 09/28/2020)     No facility-administered medications prior to visit.    No Known Allergies      Objective:    Physical Exam Vitals and nursing note reviewed.  Constitutional:      Appearance: Normal appearance.  Cardiovascular:     Rate and Rhythm: Normal rate and regular rhythm.  Pulmonary:     Effort: Pulmonary effort is normal.     Breath sounds: Normal breath sounds.  Abdominal:     General: Abdomen is flat. There is no distension. There are no signs of injury.     Palpations: There is no splenomegaly or mass.     Tenderness: There is abdominal tenderness in the periumbilical area.  Musculoskeletal:         General: Normal range of motion.  Skin:    General: Skin is warm and dry.  Neurological:     Mental Status: She is alert.  Psychiatric:        Mood and Affect: Mood normal.        Behavior: Behavior normal.    BP 113/76    Pulse (!) 59    Temp 98.4 F (36.9 C) (Temporal)    Ht 5' (1.524 m)    Wt 107 lb 6.4 oz (48.7 kg)    SpO2 99%    BMI 20.98 kg/m  Wt Readings from Last 3 Encounters:  03/23/21 107 lb 6.4 oz (48.7 kg)  09/28/20 103 lb 6.4 oz (46.9 kg)  08/31/20 103 lb 6.4 oz (46.9 kg)       Assessment & Plan:   Problem List Items Addressed This Visit       Other   Pelvic pain - Primary    denies dysuria & other UTI sx, no nausea, LMP 03/06/2021. pt has Nexplanon, usually regular cycles with regular bleeding. denies constipation, diarrhea, reports some bloating & flatulence. plays tennis, denies injury, no abdominal crunches or other workout, but does run on treadmill some. UA neg. Will send for STI testing, pt + for chlamydia last year, reports sexually active with steady BF. Also recommended trying  OTC Simethicone xtra strength as directed on package, avoid gas producing foods, straws, gum, etc.      Relevant Orders   POCT Urinalysis Dipstick (Completed)   Urine cytology ancillary only

## 2021-03-24 LAB — URINE CYTOLOGY ANCILLARY ONLY
Bacterial Vaginitis-Urine: NEGATIVE
Candida Urine: NEGATIVE
Chlamydia: NEGATIVE
Comment: NEGATIVE
Comment: NEGATIVE
Comment: NORMAL
Neisseria Gonorrhea: NEGATIVE
Trichomonas: NEGATIVE

## 2021-03-24 NOTE — Progress Notes (Signed)
good news, urine is negative for any STDs.

## 2021-04-12 DIAGNOSIS — F411 Generalized anxiety disorder: Secondary | ICD-10-CM | POA: Diagnosis not present

## 2021-04-14 DIAGNOSIS — Z3046 Encounter for surveillance of implantable subdermal contraceptive: Secondary | ICD-10-CM | POA: Diagnosis not present

## 2021-04-19 DIAGNOSIS — F411 Generalized anxiety disorder: Secondary | ICD-10-CM | POA: Diagnosis not present

## 2021-04-26 DIAGNOSIS — F411 Generalized anxiety disorder: Secondary | ICD-10-CM | POA: Diagnosis not present

## 2021-05-03 DIAGNOSIS — F411 Generalized anxiety disorder: Secondary | ICD-10-CM | POA: Diagnosis not present

## 2021-05-13 DIAGNOSIS — F411 Generalized anxiety disorder: Secondary | ICD-10-CM | POA: Diagnosis not present

## 2021-05-18 DIAGNOSIS — F411 Generalized anxiety disorder: Secondary | ICD-10-CM | POA: Diagnosis not present

## 2021-05-27 DIAGNOSIS — F411 Generalized anxiety disorder: Secondary | ICD-10-CM | POA: Diagnosis not present

## 2021-06-03 DIAGNOSIS — F411 Generalized anxiety disorder: Secondary | ICD-10-CM | POA: Diagnosis not present

## 2021-06-17 DIAGNOSIS — F411 Generalized anxiety disorder: Secondary | ICD-10-CM | POA: Diagnosis not present

## 2021-07-01 DIAGNOSIS — F411 Generalized anxiety disorder: Secondary | ICD-10-CM | POA: Diagnosis not present

## 2021-07-02 DIAGNOSIS — Z0000 Encounter for general adult medical examination without abnormal findings: Secondary | ICD-10-CM | POA: Diagnosis not present

## 2021-07-02 DIAGNOSIS — D550 Anemia due to glucose-6-phosphate dehydrogenase [G6PD] deficiency: Secondary | ICD-10-CM | POA: Diagnosis not present

## 2021-07-02 DIAGNOSIS — Z13220 Encounter for screening for lipoid disorders: Secondary | ICD-10-CM | POA: Diagnosis not present

## 2021-07-14 DIAGNOSIS — F411 Generalized anxiety disorder: Secondary | ICD-10-CM | POA: Diagnosis not present

## 2021-07-29 DIAGNOSIS — F411 Generalized anxiety disorder: Secondary | ICD-10-CM | POA: Diagnosis not present

## 2021-08-09 DIAGNOSIS — F411 Generalized anxiety disorder: Secondary | ICD-10-CM | POA: Diagnosis not present

## 2021-08-30 DIAGNOSIS — F411 Generalized anxiety disorder: Secondary | ICD-10-CM | POA: Diagnosis not present

## 2021-09-27 DIAGNOSIS — F411 Generalized anxiety disorder: Secondary | ICD-10-CM | POA: Diagnosis not present

## 2021-10-03 DIAGNOSIS — Z113 Encounter for screening for infections with a predominantly sexual mode of transmission: Secondary | ICD-10-CM | POA: Diagnosis not present

## 2021-11-29 ENCOUNTER — Encounter: Payer: Self-pay | Admitting: *Deleted

## 2022-02-17 ENCOUNTER — Encounter: Payer: Self-pay | Admitting: *Deleted

## 2023-03-08 DIAGNOSIS — Z419 Encounter for procedure for purposes other than remedying health state, unspecified: Secondary | ICD-10-CM | POA: Diagnosis not present

## 2023-04-08 DIAGNOSIS — Z419 Encounter for procedure for purposes other than remedying health state, unspecified: Secondary | ICD-10-CM | POA: Diagnosis not present

## 2023-04-17 IMAGING — MR MR ABDOMEN WO/W CM
29 of 31 series · 46 of 48 positions shown · IV contrast (9ml multihance)
Comparison: Ultrasound on 09/18/2020

CLINICAL DATA: Suspected Mullerian duct anomaly on recent
ultrasound.

EXAM:
MRI ABDOMEN AND PELVIS WITHOUT AND WITH CONTRAST
TECHNIQUE: Multiplanar multisequence MR imaging of the abdomen and pelvis was
performed both before and after the administration of intravenous
contrast. Patient returned for additional imaging of the pelvis on
10/27/2020.
CONTRAST:  9mL MULTIHANCE GADOBENATE DIMEGLUMINE 529 MG/ML IV SOLN

[Series 2: T2 · coronal · 5.0mm · 1.12mm/px · 1 of 25 slices shown (1 of 6)]
[im 1/25]
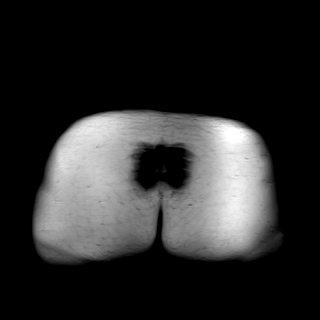

[Series 3: T2 · axial · 5.0mm · 0.41mm/px · 1 of 30 slices shown (2 of 6)]
[im 1/30]
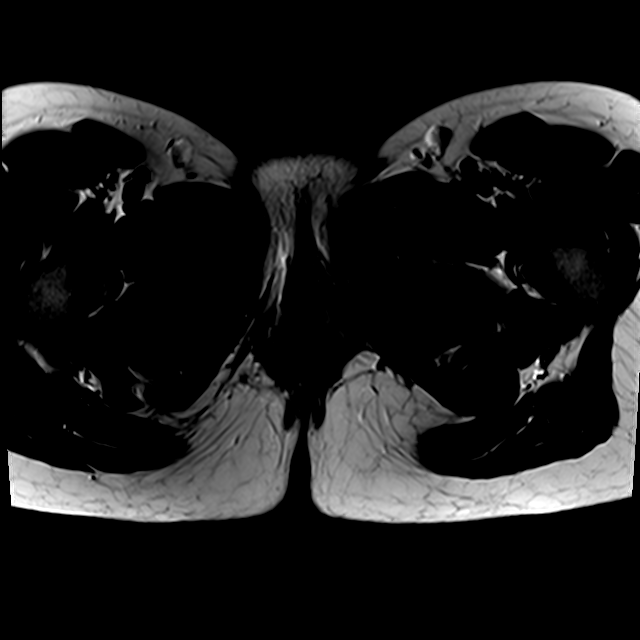

[Series 4: T2 fat-sat · axial · 5.0mm · 0.41mm/px · 1 of 30 slices shown]
[im 1/30]
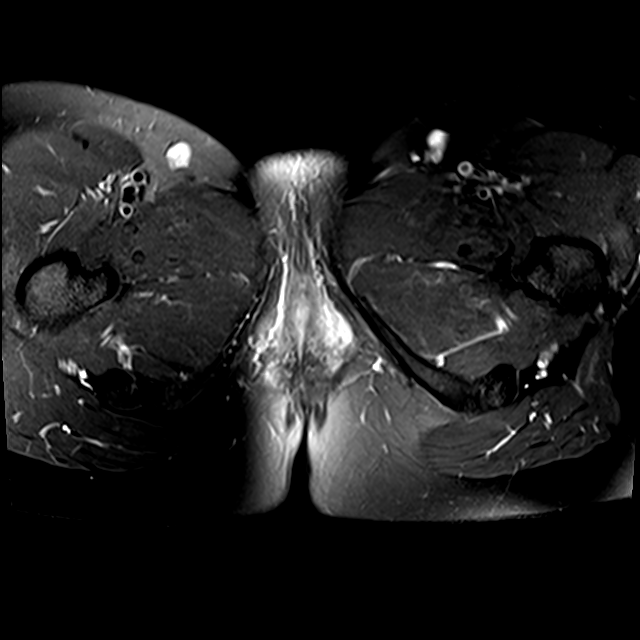

[Series 5: T2 · sagittal · 5.0mm · 0.78mm/px · 1 of 30 slices shown (3 of 6)]
[im 1/30]
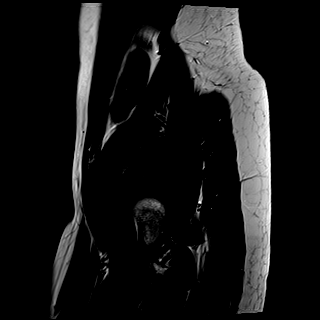

[Series 6: T1 fat-sat · axial · 1.2mm · 0.75mm/px · z∈[-120,+52]mm · 2 of 144 slices shown (1 of 2)]
[im 1/144]
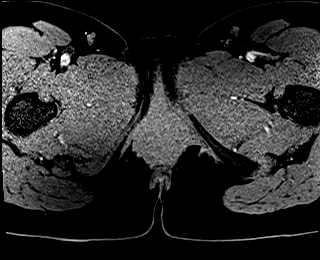
[im 144/144]
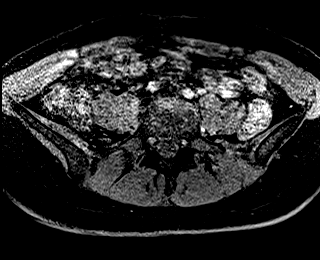

[Series 7: T1 · axial · 1.2mm · 0.75mm/px · z∈[-120,+52]mm · 2 of 144 slices shown (1 of 2)]
[im 1/144]
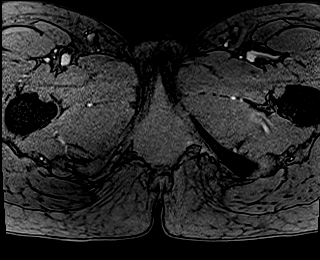
[im 144/144]
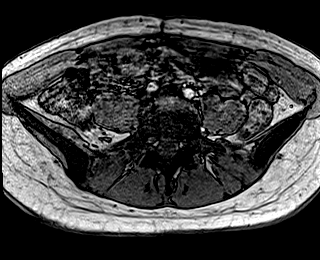

[Series 10: T2 · coronal · 5.0mm · 1.41mm/px · 1 of 33 slices shown (4 of 6)]
[im 1/33]
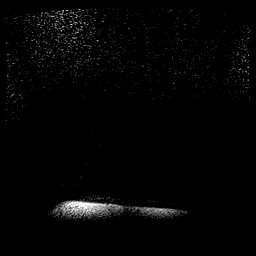

[Series 11: T1 · axial · 3.0mm · 1.12mm/px · z∈[+58,+295]mm · 2 of 160 slices shown (2 of 2)]
[im 1/160]
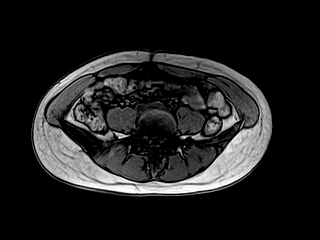
[im 160/160]
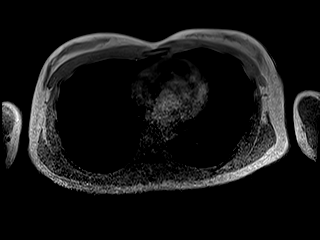

[Series 12: T2 · axial · 5.0mm · 1.41mm/px · 1 of 40 slices shown (5 of 6)]
[im 1/40]
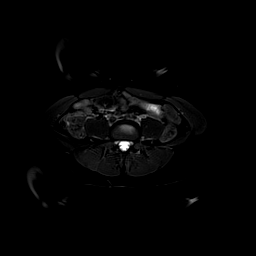

[Series 13: DWI · axial · 5.0mm · 1.42mm/px · 1 of 118 slices shown (1 of 2)]
[im 1/118]
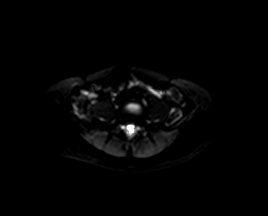

[Series 14: DWI · axial · 5.0mm · 1.42mm/px · 1 of 40 slices shown (2 of 2)]
[im 1/40]
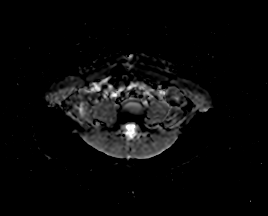

[Series 15: T2 · axial · 6.0mm · 1.22mm/px · 1 of 36 slices shown (6 of 6)]
[im 1/36]
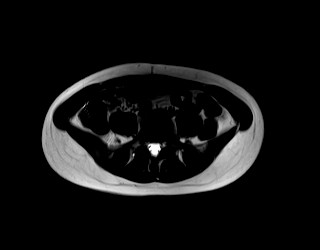

[Series 16: bSSFP · axial · 5.5mm · 1.18mm/px · 1 of 34 slices shown]
[im 1/34]
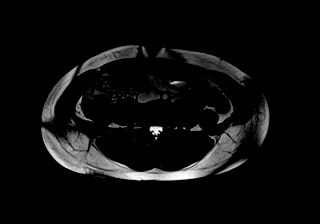

[Series 17: T1 dynamic · axial · non-contrast · 3.0mm · 1.25mm/px · 1 of 80 slices shown]
[im 1/80]
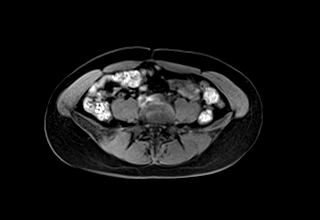

[Series 18: T1 dynamic post-contrast · axial · 3.0mm · 1.25mm/px · 1 of 80 slices shown (1 of 12)]
[im 1/80]
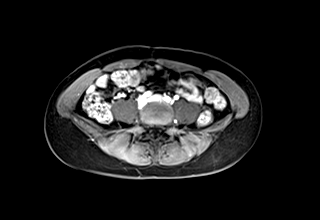

[Series 19: T1 dynamic post-contrast · axial · 3.0mm · 1.25mm/px · 1 of 80 slices shown (2 of 12)]
[im 1/80]
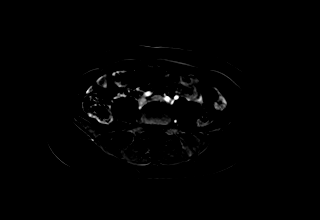

[Series 20: T1 dynamic post-contrast · axial · 3.0mm · 1.25mm/px · 1 of 80 slices shown (3 of 12)]
[im 1/80]
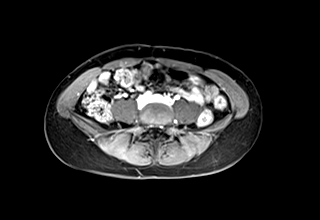

[Series 21: T1 dynamic post-contrast · axial · 3.0mm · 1.25mm/px · 1 of 80 slices shown (4 of 12)]
[im 1/80]
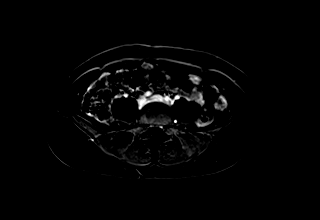

[Series 22: T1 dynamic post-contrast · axial · 3.0mm · 1.25mm/px · z∈[+58,+295]mm · 2 of 80 slices shown (5 of 12)]
[im 1/80]
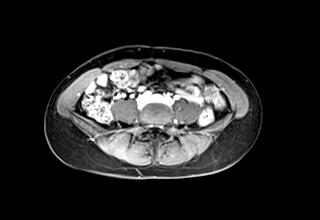
[im 80/80]
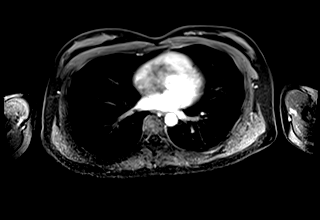

[Series 23: T1 dynamic post-contrast · axial · 3.0mm · 1.25mm/px · z∈[+58,+295]mm · 2 of 80 slices shown (6 of 12)]
[im 1/80]
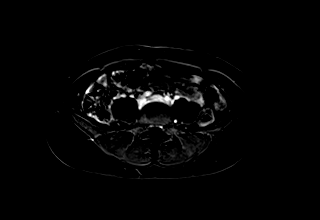
[im 80/80]
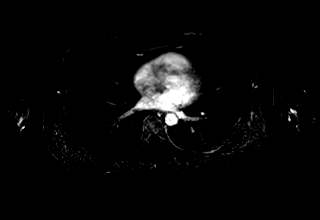

[Series 24: T1 dynamic post-contrast · coronal · 1.5mm · 1.38mm/px · 2 of 120 slices shown (7 of 12)]
[im 1/120]
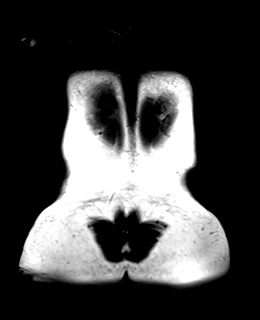
[im 120/120]
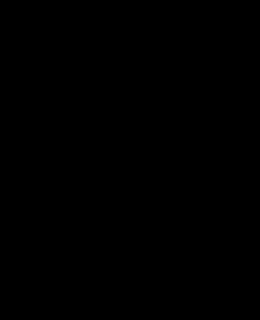

[Series 25: T1 dynamic post-contrast · coronal · 1.5mm · 1.38mm/px · 2 of 120 slices shown (8 of 12)]
[im 1/120]
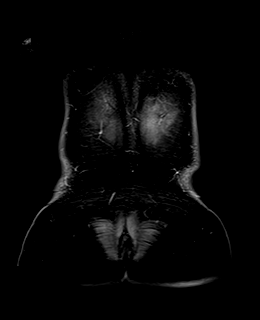
[im 120/120]
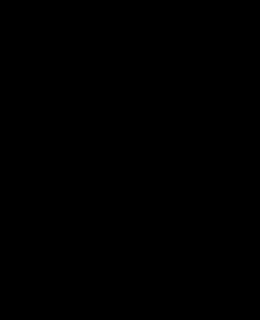

[Series 26: T1 dynamic post-contrast · axial · 3.0mm · 1.25mm/px · z∈[+58,+295]mm · 2 of 80 slices shown (9 of 12)]
[im 1/80]
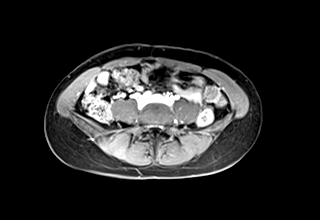
[im 80/80]
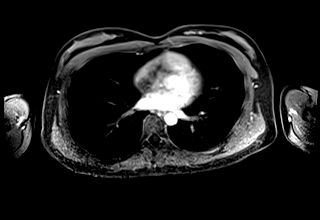

[Series 27: T1 dynamic post-contrast · axial · 3.0mm · 1.25mm/px · z∈[+58,+295]mm · 2 of 80 slices shown (10 of 12)]
[im 1/80]
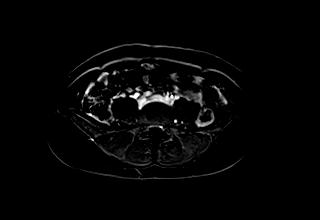
[im 80/80]
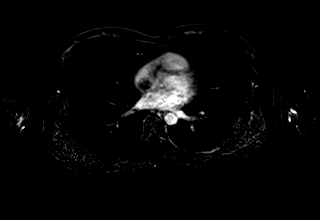

[Series 28: T1 fat-sat post-contrast · axial · 1.2mm · 0.75mm/px · z∈[-120,+52]mm · 3 of 144 slices shown]
[im 1/144]
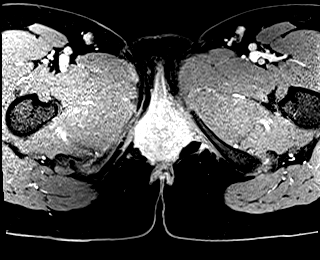
[im 72/144]
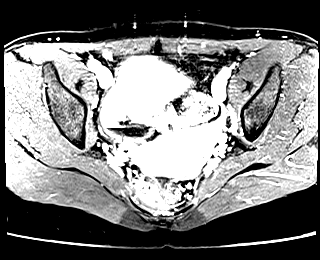
[im 144/144]
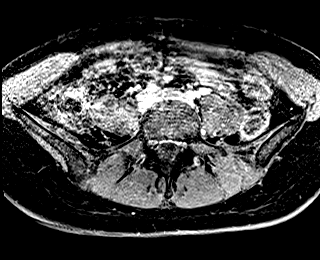

[Series 29: T1 fat-sat · coronal · 1.2mm · 0.75mm/px · 3 of 144 slices shown (2 of 2)]
[im 1/144]
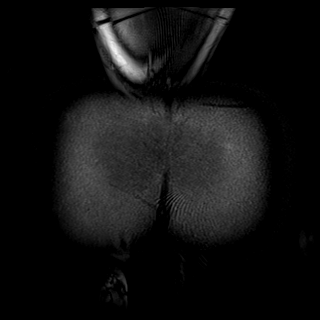
[im 72/144]
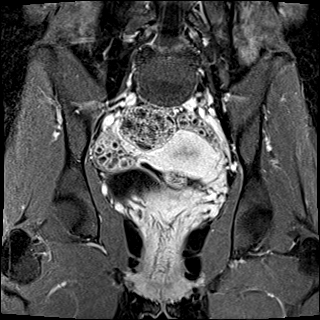
[im 144/144]
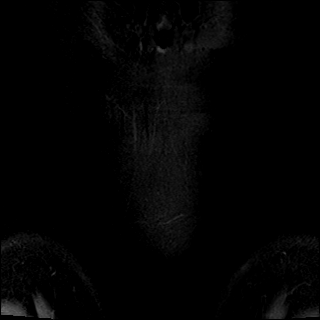

[Series 30: T1 dynamic post-contrast · axial · 3.0mm · 1.25mm/px · z∈[+58,+295]mm · 2 of 80 slices shown (11 of 12)]
[im 1/80]
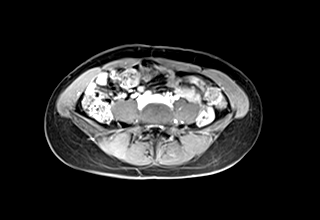
[im 80/80]
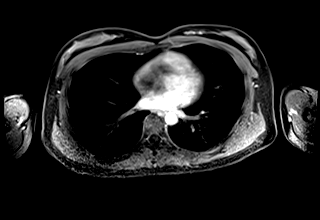

[Series 31: T1 dynamic post-contrast · axial · 3.0mm · 1.25mm/px · z∈[+58,+295]mm · 2 of 80 slices shown (12 of 12)]
[im 1/80]
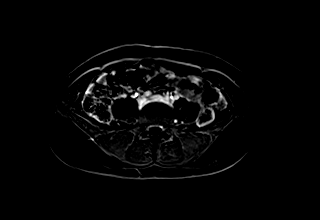
[im 80/80]
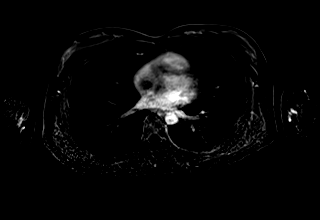

[Series 100: pelvis sub · axial · 1.2mm · 0.75mm/px · z∈[-120,+52]mm · 3 of 144 slices shown]
[im 1/144]
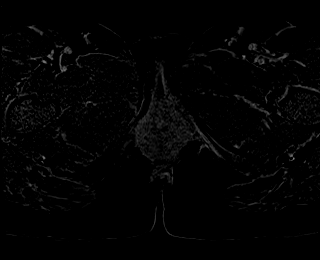
[im 72/144]
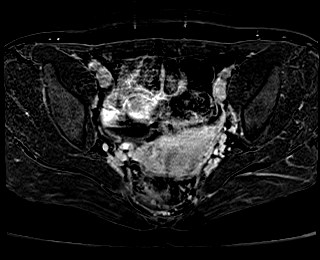
[im 144/144]
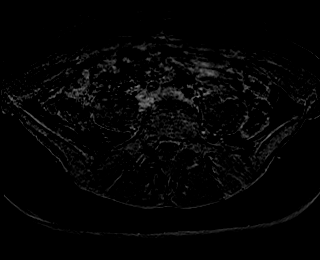

[46 of 48 positions shown; findings below may reference images not displayed]

FINDINGS: COMBINED FINDINGS FOR BOTH MR ABDOMEN AND PELVIS

Lower Chest: No acute findings.

Hepatobiliary: No hepatic masses identified. Layering gallbladder
sludge noted, however gallbladder is otherwise unremarkable. No
evidence of biliary ductal dilatation.

Pancreas:  No mass or inflammatory changes.

Spleen: Within normal limits in size and appearance.

Adrenals/Urinary Tract: Both kidneys are normal in size and
location. No renal anomalies identified. Tiny sub-cm cyst noted in
upper pole of right kidney. No masses identified. No evidence of
hydronephrosis.

Stomach/Bowel: No evidence of obstruction, inflammatory process or
abnormal fluid collections.

Vascular/Lymphatic: No pathologically enlarged lymph nodes. No acute
vascular findings.

Reproductive:

-- Uterus: Measures 6.8 x 3.5 by 4.6 cm (volume = 57 cm^3).
Anteflexed. No fibroids or other masses identified. Smooth fundal
contour of the uterus is seen, without evidence of myometrial cleft.
Single cervix is noted. A thin linear T2 hypointensity is seen
within the endometrial cavity and endocervical canal (e.g. Images
14, 15, 18/series 4). This is suspicious for a thin myometrial
septum. Normal endometrial thickness.

-- Right ovary: Appears normal. No mass or inflammatory process
identified.

-- Left ovary: Appears normal. No mass or inflammatory process
identified.

Other:  None.

Musculoskeletal:  No suspicious bone lesions identified.
IMPRESSION: Probable thin myometrial septum in the endometrial cavity and
endocervical canal, highly suspicious for septate uterus. No
evidence of fundal myometrial cleft.

No evidence of congenital renal anomaly or other abnormality within
the abdomen.

## 2023-05-06 DIAGNOSIS — Z419 Encounter for procedure for purposes other than remedying health state, unspecified: Secondary | ICD-10-CM | POA: Diagnosis not present

## 2023-05-16 ENCOUNTER — Encounter: Payer: Self-pay | Admitting: Nurse Practitioner

## 2023-05-16 ENCOUNTER — Ambulatory Visit (INDEPENDENT_AMBULATORY_CARE_PROVIDER_SITE_OTHER): Admitting: Nurse Practitioner

## 2023-05-16 VITALS — BP 118/66 | HR 84 | Temp 98.4°F | Ht 59.0 in | Wt 107.8 lb

## 2023-05-16 DIAGNOSIS — J029 Acute pharyngitis, unspecified: Secondary | ICD-10-CM

## 2023-05-16 DIAGNOSIS — R6889 Other general symptoms and signs: Secondary | ICD-10-CM | POA: Diagnosis not present

## 2023-05-16 DIAGNOSIS — H6993 Unspecified Eustachian tube disorder, bilateral: Secondary | ICD-10-CM | POA: Diagnosis not present

## 2023-05-16 DIAGNOSIS — J0140 Acute pansinusitis, unspecified: Secondary | ICD-10-CM | POA: Diagnosis not present

## 2023-05-16 LAB — POCT INFLUENZA A/B
Influenza A, POC: NEGATIVE
Influenza B, POC: NEGATIVE

## 2023-05-16 LAB — POC COVID19 BINAXNOW: SARS Coronavirus 2 Ag: NEGATIVE

## 2023-05-16 LAB — POCT RAPID STREP A (OFFICE): Rapid Strep A Screen: NEGATIVE

## 2023-05-16 MED ORDER — FLUTICASONE PROPIONATE 50 MCG/ACT NA SUSP: 2.0000 | Freq: Every day | NASAL | 0 refills | Status: DC

## 2023-05-16 MED ORDER — AMOXICILLIN-POT CLAVULANATE 875-125 MG PO TABS: 1.0000 | ORAL_TABLET | Freq: Two times a day (BID) | ORAL | 0 refills | Status: DC

## 2023-05-16 NOTE — Progress Notes (Signed)
 Acute Office Visit  Subjective:    Patient ID: Tara Merritt, female    DOB: 1995/06/25, 28 y.o.   MRN: [id number]  Chief Complaint  Patient presents with   Sore Throat    Started Wednesday/Thursday    Nasal Congestion    Yellow-greenish    Fatigue    No energy and fever some days    URI  This is a new problem. The current episode started in the past 7 days. The problem has been gradually worsening. Associated symptoms include congestion, coughing, ear pain, headaches, joint pain, a plugged ear sensation, rhinorrhea, sinus pain, sneezing, a sore throat and swollen glands. Pertinent negatives include no abdominal pain, chest pain, diarrhea, dysuria, joint swelling, nausea, neck pain, rash, vomiting or wheezing. She has tried antihistamine, decongestant, acetaminophen and increased fluids for the symptoms. The treatment provided no relief.   No outpatient medications prior to visit.   No facility-administered medications prior to visit.   Reviewed past medical and social history.  Review of Systems  HENT:  Positive for congestion, ear pain, rhinorrhea, sinus pain, sneezing and sore throat.   Respiratory:  Positive for cough. Negative for wheezing.   Cardiovascular:  Negative for chest pain.  Gastrointestinal:  Negative for abdominal pain, diarrhea, nausea and vomiting.  Genitourinary:  Negative for dysuria.  Musculoskeletal:  Positive for joint pain. Negative for neck pain.  Skin:  Negative for rash.  Neurological:  Positive for headaches.   Per HPI     Objective:    Physical Exam Vitals and nursing note reviewed.  Constitutional:      General: She is not in acute distress. HENT:     Right Ear: Ear canal and external ear normal. No drainage. A middle ear effusion is present. Tympanic membrane is not erythematous.     Left Ear: Ear canal and external ear normal. No drainage. A middle ear effusion is present. Tympanic membrane is not erythematous.     Nose: Congestion and  rhinorrhea present. No nasal tenderness or mucosal edema.     Right Nostril: No occlusion.     Left Nostril: No occlusion.     Right Turbinates: Not enlarged, swollen or pale.     Left Turbinates: Not enlarged, swollen or pale.     Right Sinus: No maxillary sinus tenderness or frontal sinus tenderness.     Left Sinus: No maxillary sinus tenderness or frontal sinus tenderness.     Mouth/Throat:     Pharynx: Oropharynx is clear. Uvula midline.     Tonsils: No tonsillar exudate or tonsillar abscesses.  Eyes:     Extraocular Movements: Extraocular movements intact.     Conjunctiva/sclera: Conjunctivae normal.  Cardiovascular:     Rate and Rhythm: Normal rate and regular rhythm.     Pulses: Normal pulses.     Heart sounds: Normal heart sounds.  Pulmonary:     Effort: Pulmonary effort is normal.     Breath sounds: Normal breath sounds.  Musculoskeletal:     Cervical back: Normal range of motion and neck supple.  Lymphadenopathy:     Cervical: Cervical adenopathy present.  Skin:    Findings: No rash.  Neurological:     Mental Status: She is alert and oriented to person, place, and time.     BP 118/66 (BP Location: Left Arm, Patient Position: Sitting, Cuff Size: Normal)   Pulse 84   Temp 98.4 F (36.9 C) (Temporal)   Ht 4\' 11"  (1.499 m)   Wt 107 lb  12.8 oz (48.9 kg)   LMP 05/12/2023   SpO2 99%   BMI 21.77 kg/m    Results for orders placed or performed in visit on 05/16/23  POCT Influenza A/B  Result Value Ref Range   Influenza A, POC Negative Negative   Influenza B, POC Negative Negative  POC COVID-19 BinaxNow  Result Value Ref Range   SARS Coronavirus 2 Ag Negative Negative  POCT rapid strep A  Result Value Ref Range   Rapid Strep A Screen Negative Negative      Assessment & Plan:   Problem List Items Addressed This Visit   None Visit Diagnoses       Acute non-recurrent pansinusitis    -  Primary   Relevant Medications   amoxicillin-clavulanate (AUGMENTIN)  875-125 MG tablet   fluticasone (FLONASE) 50 MCG/ACT nasal spray     Eustachian tube dysfunction, bilateral       Relevant Medications   amoxicillin-clavulanate (AUGMENTIN) 875-125 MG tablet   fluticasone (FLONASE) 50 MCG/ACT nasal spray     Sore throat       Relevant Orders   POCT Influenza A/B (Completed)   POC COVID-19 BinaxNow (Completed)   POCT rapid strep A (Completed)     Flu-like symptoms       Relevant Orders   POCT Influenza A/B (Completed)   POC COVID-19 BinaxNow (Completed)   POCT rapid strep A (Completed)      Meds ordered this encounter  Medications   amoxicillin-clavulanate (AUGMENTIN) 875-125 MG tablet    Sig: Take 1 tablet by mouth 2 (two) times daily.    Dispense:  14 tablet    Refill:  0    Supervising Provider:   Nadene Rubins ALFRED [[id number]]   fluticasone (FLONASE) 50 MCG/ACT nasal spray    Sig: Place 2 sprays into both nostrils daily.    Dispense:  16 g    Refill:  0    Supervising Provider:   Mliss Sax [[id number]]   Return if symptoms worsen or fail to improve.  Alysia Penna, NP

## 2023-05-16 NOTE — Patient Instructions (Signed)
 Flonase apply 2sprays of flonase in each nare.  Encourage adequate oral hydration. Use mucinex DM for cough You can use plain "Tylenol" or "Advil" for fever, chills and achyness. Use cool mist humidifier at bedtime to help with nasal congestion and cough.  Cold/cough medications may have tylenol or ibuprofen or guaifenesin or dextromethophan in them, so be careful not to take beyond the recommended dose for each of these medications.

## 2023-06-17 DIAGNOSIS — Z419 Encounter for procedure for purposes other than remedying health state, unspecified: Secondary | ICD-10-CM | POA: Diagnosis not present

## 2023-07-11 ENCOUNTER — Encounter: Payer: Self-pay | Admitting: Family

## 2023-07-11 ENCOUNTER — Ambulatory Visit (INDEPENDENT_AMBULATORY_CARE_PROVIDER_SITE_OTHER): Admitting: Family

## 2023-07-11 ENCOUNTER — Telehealth: Payer: Self-pay

## 2023-07-11 VITALS — BP 112/73 | HR 50 | Temp 97.5°F | Ht 59.0 in | Wt 107.1 lb

## 2023-07-11 DIAGNOSIS — A09 Infectious gastroenteritis and colitis, unspecified: Secondary | ICD-10-CM

## 2023-07-11 DIAGNOSIS — D75A Glucose-6-phosphate dehydrogenase (G6PD) deficiency without anemia: Secondary | ICD-10-CM | POA: Insufficient documentation

## 2023-07-11 DIAGNOSIS — Z7184 Encounter for health counseling related to travel: Secondary | ICD-10-CM | POA: Diagnosis not present

## 2023-07-11 DIAGNOSIS — N904 Leukoplakia of vulva: Secondary | ICD-10-CM | POA: Insufficient documentation

## 2023-07-11 DIAGNOSIS — Z2989 Encounter for other specified prophylactic measures: Secondary | ICD-10-CM

## 2023-07-11 MED ORDER — AZITHROMYCIN 500 MG PO TABS: ORAL_TABLET | ORAL | 0 refills | Status: AC

## 2023-07-11 MED ORDER — ATOVAQUONE-PROGUANIL HCL 250-100 MG PO TABS: 1.0000 | ORAL_TABLET | Freq: Every day | ORAL | 0 refills | Status: AC

## 2023-07-11 NOTE — Progress Notes (Signed)
 Patient ID: Tara Merritt, female    DOB: 1996/01/24, 28 y.o.   MRN: [id number]  Chief Complaint  Patient presents with   Travel Consult    Pt is going to [place], May 16.   Discussed the use of AI scribe software for clinical note transcription with the patient, who gave verbal consent to proceed.  History of Present Illness Tara Merritt is a 28 year old female who presents for travel consultation regarding her upcoming trip to [place] and [place].  She seeks advice on food and water safety in [place] and is considering taking antibiotics for potential illness during her two-week stay. Her vaccinations include hepatitis A and B, MMR, polio, and a tetanus booster three years ago. She is concerned about malaria risk in [place] and plans to use preventive measures like bug spray. 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 She is considering taking a probiotic before her trip to prevent traveler's diarrhea and azithromycin if symptoms occur. Assessment and Plan Assessment & Plan Travel advice - Traveling to [place] and [place]. Per review of CDC.gov travel advisory website, discussed health risks including malaria (if <2000 feet elevation) and traveler's diarrhea. Malarone and azithromycin are recommended for prophylaxis. Side effects of Malarone reviewed. Probiotics recommended for gut health. Vaccinations are utd: MMR, hepatitis A and B, polio. - Prescribe 10 pills of Malarone, one daily starting one day before travel. - Prescribe azithromycin for traveler's diarrhea, three-day course if symptoms occur. - Recommend starting probiotics a few days before travel. - Advise on insect repellent use to prevent mosquito bites.  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 Malarone side effects discussed, no current liver concerns. - Monitor liver function as needed, especially with new medications.   Subjective:    Outpatient Medications Prior  to Visit  Medication Sig Dispense Refill   amoxicillin -clavulanate (AUGMENTIN ) 875-125 MG tablet Take 1 tablet by mouth 2 (two) times daily. (Patient not taking: Reported on 07/11/2023) 14 tablet 0   fluticasone  (FLONASE ) 50 MCG/ACT nasal spray Place 2 sprays into both nostrils daily. (Patient not taking: Reported on 07/11/2023) 16 g 0   No facility-administered medications prior to visit.   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  Constitutional:      Appearance: Normal appearance.  Cardiovascular:     Rate and Rhythm: Normal rate and regular rhythm.  Pulmonary:     Effort: Pulmonary effort is normal.     Breath sounds: Normal breath sounds.  Musculoskeletal:        General: Normal range of motion.  Skin:    General: Skin is warm and dry.  Neurological:     Mental Status: She is alert.  Psychiatric:        Mood and Affect: Mood normal.        Behavior: Behavior normal.    BP 112/73 (BP Location: Left Arm, Patient Position: Sitting, Cuff Size: Large)   Pulse (!) 50   Temp (!) 97.5 F (36.4 C) (Temporal)   Ht 4\' 11"  (1.499 m)   Wt 107 lb 2 oz (48.6 kg)   LMP 06/21/2023   SpO2 100%  BMI 21.64 kg/m  Wt Readings from Last 3 Encounters:  07/11/23 107 lb 2 oz (48.6 kg)  05/16/23 107 lb 12.8 oz (48.9 kg)  03/23/21 107 lb 6.4 oz (48.7 kg)       Versa Gore, NP

## 2023-07-11 NOTE — Telephone Encounter (Signed)
 Copied from CRM [id number]. Topic: Appointments - Transfer of Care >> Jul 10, 2023  4:16 PM Aisha D wrote: Pt is requesting to transfer FROM: [facility], Dr.Camille [place] Pt is requesting to transfer TO: [facility], [place],NP Reason for requested transfer: Looking for new PCP It is the responsibility of the team the patient would like to transfer to (Dr. Ethelyn Herbert) to reach out to the patient if for any reason this transfer is not acceptable.

## 2023-07-13 ENCOUNTER — Ambulatory Visit (INDEPENDENT_AMBULATORY_CARE_PROVIDER_SITE_OTHER): Admitting: Family

## 2023-07-13 ENCOUNTER — Ambulatory Visit: Admitting: Family Medicine

## 2023-07-13 ENCOUNTER — Telehealth

## 2023-07-13 VITALS — BP 124/78 | HR 86 | Temp 97.5°F | Ht 59.0 in | Wt 106.5 lb

## 2023-07-13 DIAGNOSIS — J309 Allergic rhinitis, unspecified: Secondary | ICD-10-CM | POA: Diagnosis not present

## 2023-07-13 NOTE — Progress Notes (Signed)
 Patient ID: Tara Merritt, female    DOB: October 04, 1995, 28 y.o.   MRN: [id number]  Chief Complaint  Patient presents with   Sinus Problem    Pt c/o chills, headache, nasal congestion and cough. Present for 3 days, Has tried saline spray OTC, which did help slightly. Covid/flu negative yesterday at home.   Discussed the use of AI scribe software for clinical note transcription with the patient, who gave verbal consent to proceed.  History of Present Illness Tara Merritt is a 28 year old female who presents with upper respiratory symptoms and nasal congestion.  Symptoms began on Tuesday evening with a throat sensation, progressing to a dry cough and nasal congestion by Wednesday morning. Nasal congestion is more pronounced in the left nostril with bloody discharge. At-home COVID and flu tests are negative. She uses an over-the-counter saline nasal spray for symptom management and is uncertain about using Flonase , previously prescribed for a sinus infection three months ago. She experiences a dry cough and sore throat without significant chest involvement. No adult history of strep throat, though she had it a few times in high school.  Assessment & Plan Sinusitis - Negative COVID-19 and influenza tests. Symptoms include dry cough, nasal congestion, frontal headache, nasal drainage, and left-sided epistaxis. Differential includes viral infection versus allergy exacerbation. Discussed stress and potential vitamin deficiencies in recurrent infections. - Use Flonase  nasal spray, one squirt each side twice daily for a few days, then once daily. - Recommend up to 600mg  ibuprofen for pain and inflammation. - Continue saline nasal spray for nasal moisture. - Encourage adequate hydration, 2 liters water daily. - Contact if no improvement by Monday.  Allergic rhinitis Current symptoms may be exacerbated by high pollen counts. Discussed Flonase  for allergy symptoms, including nasal congestion and  throat mucus. - Use Flonase  nasal spray as directed above for allergy symptoms. - Continue saline nasal spray for nasal moisture.   Subjective:     Outpatient Medications Prior to Visit  Medication Sig Dispense Refill   atovaquone-proguanil (MALARONE) 250-100 MG TABS tablet Take 1 tablet by mouth daily. 10 tablet 0   azithromycin (ZITHROMAX) 500 MG tablet Take once daily for travelers diarrhea- severe diarrhea characterized by fever and blood, pus, or mucus in the stool. 3 tablet 0   amoxicillin -clavulanate (AUGMENTIN ) 875-125 MG tablet Take 1 tablet by mouth 2 (two) times daily. (Patient not taking: Reported on 07/11/2023) 14 tablet 0   fluticasone  (FLONASE ) 50 MCG/ACT nasal spray Place 2 sprays into both nostrils daily. (Patient not taking: Reported on 07/11/2023) 16 g 0   No facility-administered medications prior to visit.   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  Constitutional:      Appearance: Normal appearance.  HENT:     Right Ear: Tympanic membrane and ear canal normal.     Left Ear: Tympanic membrane and ear canal normal.     Nose: Congestion and rhinorrhea present.     Right Sinus: No frontal sinus tenderness (pressure).     Left Sinus: No frontal sinus tenderness (pressure).     Mouth/Throat:     Mouth: Mucous membranes are moist.     Pharynx: Postnasal drip present. No  pharyngeal swelling, oropharyngeal exudate, posterior oropharyngeal erythema or uvula swelling.  Cardiovascular:     Rate and Rhythm: Normal rate and regular rhythm.  Pulmonary:     Effort: Pulmonary effort is normal.     Breath sounds: Normal breath sounds.  Musculoskeletal:        General: Normal range of motion.   Lymphadenopathy:     Head:     Right side of head: No submandibular, tonsillar, preauricular, posterior auricular or occipital adenopathy.     Left side of head: No submandibular, tonsillar, preauricular, posterior auricular or occipital adenopathy.     Cervical: No cervical adenopathy.  Skin:    General: Skin is warm and dry.  Neurological:     Mental Status: She is alert.  Psychiatric:        Mood and Affect: Mood normal.        Behavior: Behavior normal.    BP 124/78 (BP Location: Left Arm, Patient Position: Sitting, Cuff Size: Normal)   Pulse 86   Temp (!) 97.5 F (36.4 C) (Temporal)   Ht 4\' 11"  (1.499 m)   Wt 106 lb 8 oz (48.3 kg)   LMP 06/21/2023   SpO2 100%   BMI 21.51 kg/m  Wt Readings from Last 3 Encounters:  07/13/23 106 lb 8 oz (48.3 kg)  07/11/23 107 lb 2 oz (48.6 kg)  05/16/23 107 lb 12.8 oz (48.9 kg)       Versa Gore, NP

## 2023-07-17 ENCOUNTER — Ambulatory Visit (INDEPENDENT_AMBULATORY_CARE_PROVIDER_SITE_OTHER): Admitting: Family Medicine

## 2023-07-17 ENCOUNTER — Ambulatory Visit: Payer: Self-pay

## 2023-07-17 ENCOUNTER — Encounter: Payer: Self-pay | Admitting: Family Medicine

## 2023-07-17 VITALS — BP 103/66 | HR 56 | Temp 98.2°F | Ht 59.0 in | Wt 105.8 lb

## 2023-07-17 DIAGNOSIS — J329 Chronic sinusitis, unspecified: Secondary | ICD-10-CM | POA: Diagnosis not present

## 2023-07-17 DIAGNOSIS — Z419 Encounter for procedure for purposes other than remedying health state, unspecified: Secondary | ICD-10-CM | POA: Diagnosis not present

## 2023-07-17 MED ORDER — AZELASTINE HCL 0.1 % NA SOLN: 2.0000 | Freq: Two times a day (BID) | NASAL | 12 refills | Status: AC

## 2023-07-17 MED ORDER — PROMETHAZINE-DM 6.25-15 MG/5ML PO SYRP: 5.0000 mL | ORAL_SOLUTION | Freq: Four times a day (QID) | ORAL | 0 refills | Status: AC | PRN

## 2023-07-17 MED ORDER — AMOXICILLIN-POT CLAVULANATE 875-125 MG PO TABS: 1.0000 | ORAL_TABLET | Freq: Two times a day (BID) | ORAL | 0 refills | Status: AC

## 2023-07-17 NOTE — Patient Instructions (Signed)
 It was very nice to see you today!  I think you have a sinus infection.  Please start the antibiotic.  Take the cough medication and nasal spray as well.  Let us  know if not improving in the next several days.  Return if symptoms worsen or fail to improve.   Take care, Dr Daneil Dunker  PLEASE NOTE:  If you had any lab tests, please let us  know if you have not heard back within a few days. You may see your results on mychart before we have a chance to review them but we will give you a call once they are reviewed by us .   If we ordered any referrals today, please let us  know if you have not heard from their office within the next week.   If you had any urgent prescriptions sent in today, please check with the pharmacy within an hour of our visit to make sure the prescription was transmitted appropriately.   Please try these tips to maintain a healthy lifestyle:  Eat at least 3 REAL meals and 1-2 snacks per day.  Aim for no more than 5 hours between eating.  If you eat breakfast, please do so within one hour of getting up.   Each meal should contain half fruits/vegetables, one quarter protein, and one quarter carbs (no bigger than a computer mouse)  Cut down on sweet beverages. This includes juice, soda, and sweet tea.   Drink at least 1 glass of water with each meal and aim for at least 8 glasses per day  Exercise at least 150 minutes every week.

## 2023-07-17 NOTE — Progress Notes (Signed)
   Tara Merritt is a 28 y.o. female who presents today for an office visit.  Assessment/Plan:  Sinusitis  No red flags.  Given length of symptoms and reworsening over the last day or so would be reasonable for us  to start antibiotics at this point.  She has failed conservative management after being seen here last week.  Will start Augmentin .  Also start promethazine  dextromethorphan cough syrup.  Will also start Astelin nasal spray.  Encouraged hydration.  She can use over-the-counter meds as needed.  She will let us  know if not improving.  She does have upcoming international trip.  If symptoms do not start to improve in the next few days would consider switching to Levaquin prior to her flight on Friday.     Subjective:  HPI:  See A/P for status of chronic conditions.  Patient is here today with cough.  She was seen here 4 days ago by different provider.  At that time COVID and flu test were negative.  She was recommended to start Flonase .She is having a lot of nasal congestion. Symptoms got much worse this morning. Still having a lot of mucus production. Green in color. Able to take full breaths. No other treatments tried.  She has upcoming trip to [place] on Friday.        Objective:  Physical Exam: BP 103/66   Pulse (!) 56   Temp 98.2 F (36.8 C) (Temporal)   Ht 4\' 11"  (1.499 m)   Wt 105 lb 12.8 oz (48 kg)   LMP 06/21/2023   SpO2 99%   BMI 21.37 kg/m   Gen: No acute distress, resting comfortably HEENT: TMs with clear effusion.  OP erythematous.  Nasal mucosa erythematous and boggy bilaterally. CV: Regular rate and rhythm with no murmurs appreciated Pulm: Normal work of breathing, clear to auscultation bilaterally with no crackles, wheezes, or rhonchi Neuro: Grossly normal, moves all extremities Psych: Normal affect and thought content      Tara Yamashiro M. Daneil Dunker, MD 07/17/2023 1:25 PM

## 2023-07-17 NOTE — Telephone Encounter (Signed)
 Noted.

## 2023-07-17 NOTE — Telephone Encounter (Signed)
 Summary: COUGHING   Copied From CRM [id number]. Reason for Triage: PT NOT FEELING BETTER AFTER SEEING STEPHANIE HUDNELL, COUGHING STILL. WAS ADVISED TO CALL BACK IF NOT FEELING BETTER.          Chief Complaint: cough Symptoms: chest congestion Frequency: constant Pertinent Negatives: Patient denies fever Disposition: [] ED /[] Urgent Care (no appt availability in office) / [] Appointment(In office/virtual)/ []  [facility]/ [] Home Care/ [] Refused Recommended Disposition /[] [facility]/ []  Follow-up with PCP Additional Notes: Pt seen on 5/8 in office, diagnosed with Allergic sinusitis and advised to home care with flonase  and advil. Pt now reporting worsening symptoms with chest congestion and productive cough. Appt scheduled for today at 1:20pm Reason for Disposition  [1] Continuous (nonstop) coughing interferes with work or school AND [2] no improvement using cough treatment per Care Advice  Answer Assessment - Initial Assessment Questions 1. ONSET: "When did the cough begin?"      Started coughing last night  2. SEVERITY: "How bad is the cough today?"      Getting worse  3. SPUTUM: "Describe the color of your sputum" (none, dry cough; clear, white, yellow, green)     Dark green and light green  4. HEMOPTYSIS: "Are you coughing up any blood?" If so ask: "How much?" (flecks, streaks, tablespoons, etc.)     No  5. DIFFICULTY BREATHING: "Are you having difficulty breathing?" If Yes, ask: "How bad is it?" (e.g., mild, moderate, severe)    - MILD: No SOB at rest, mild SOB with walking, speaks normally in sentences, can lie down, no retractions, pulse < 100.    - MODERATE: SOB at rest, SOB with minimal exertion and prefers to sit, cannot lie down flat, speaks in phrases, mild retractions, audible wheezing, pulse 100-120.    - SEVERE: Very SOB at rest, speaks in single words, struggling to breathe, sitting hunched forward, retractions, pulse > 120      No   6. FEVER:  "Do you have a fever?" If Yes, ask: "What is your temperature, how was it measured, and when did it start?"     No fever  7. CARDIAC HISTORY: "Do you have any history of heart disease?" (e.g., heart attack, congestive heart failure)      No  8. LUNG HISTORY: "Do you have any history of lung disease?"  (e.g., pulmonary embolus, asthma, emphysema)     No  9. PE RISK FACTORS: "Do you have a history of blood clots?" (or: recent major surgery, recent prolonged travel, bedridden)     No  10. OTHER SYMPTOMS: "Do you have any other symptoms?" (e.g., runny nose, wheezing, chest pain)       Chest congestion  11. PREGNANCY: "Is there any chance you are pregnant?" "When was your last menstrual period?"       LMP was 1 month ago  12. TRAVEL: "Have you traveled out of the country in the last month?" (e.g., travel history, exposures)       No  Protocols used: Cough - Acute Productive-A-AH

## 2023-08-17 DIAGNOSIS — Z419 Encounter for procedure for purposes other than remedying health state, unspecified: Secondary | ICD-10-CM | POA: Diagnosis not present

## 2023-09-01 DIAGNOSIS — F4323 Adjustment disorder with mixed anxiety and depressed mood: Secondary | ICD-10-CM | POA: Diagnosis not present

## 2023-09-05 DIAGNOSIS — F4323 Adjustment disorder with mixed anxiety and depressed mood: Secondary | ICD-10-CM | POA: Diagnosis not present

## 2023-09-11 DIAGNOSIS — F4323 Adjustment disorder with mixed anxiety and depressed mood: Secondary | ICD-10-CM | POA: Diagnosis not present

## 2023-10-06 DIAGNOSIS — F4323 Adjustment disorder with mixed anxiety and depressed mood: Secondary | ICD-10-CM | POA: Diagnosis not present

## 2023-10-12 ENCOUNTER — Encounter: Admitting: Nurse Practitioner

## 2023-11-17 DIAGNOSIS — F4323 Adjustment disorder with mixed anxiety and depressed mood: Secondary | ICD-10-CM | POA: Diagnosis not present

## 2024-01-18 ENCOUNTER — Encounter: Admitting: Nurse Practitioner

## 2024-02-08 DIAGNOSIS — F4323 Adjustment disorder with mixed anxiety and depressed mood: Secondary | ICD-10-CM | POA: Diagnosis not present

## 2024-02-22 DIAGNOSIS — F4323 Adjustment disorder with mixed anxiety and depressed mood: Secondary | ICD-10-CM | POA: Diagnosis not present
